# Patient Record
Sex: Female | Born: 1965 | Race: White | Hispanic: No | Marital: Married | Smoking: Never smoker
Health system: Southern US, Community
[De-identification: ages and names within clinical notes are randomized; demographics above are authoritative.]

## PROBLEM LIST (undated history)

## (undated) DIAGNOSIS — M545 Low back pain, unspecified: Secondary | ICD-10-CM

## (undated) DIAGNOSIS — K5901 Slow transit constipation: Secondary | ICD-10-CM

## (undated) DIAGNOSIS — M5136 Other intervertebral disc degeneration, lumbar region: Secondary | ICD-10-CM

## (undated) DIAGNOSIS — N3281 Overactive bladder: Secondary | ICD-10-CM

## (undated) DIAGNOSIS — R32 Unspecified urinary incontinence: Secondary | ICD-10-CM

## (undated) DIAGNOSIS — K219 Gastro-esophageal reflux disease without esophagitis: Secondary | ICD-10-CM

## (undated) DIAGNOSIS — R42 Dizziness and giddiness: Secondary | ICD-10-CM

## (undated) DIAGNOSIS — R0789 Other chest pain: Secondary | ICD-10-CM

## (undated) DIAGNOSIS — R1013 Epigastric pain: Secondary | ICD-10-CM

## (undated) DIAGNOSIS — Z862 Personal history of diseases of the blood and blood-forming organs and certain disorders involving the immune mechanism: Secondary | ICD-10-CM

## (undated) DIAGNOSIS — M722 Plantar fascial fibromatosis: Secondary | ICD-10-CM

## (undated) HISTORY — DX: Unspecified urinary incontinence: R32

## (undated) HISTORY — DX: Other chest pain: R07.89

## (undated) HISTORY — DX: Low back pain, unspecified: M54.50

## (undated) HISTORY — PX: AUGMENTATION MAMMAPLASTY: SUR837

## (undated) HISTORY — DX: Dizziness and giddiness: R42

---
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
## 2013-11-23 ENCOUNTER — Other Ambulatory Visit (HOSPITAL_COMMUNITY)
Admission: RE | Admit: 2013-11-23 | Discharge: 2013-11-23 | Disposition: A | Discharge status: Home / Self Care | Payer: Managed Care, Other (non HMO) | Source: Ambulatory Visit | Attending: Family Medicine | Admitting: Family Medicine

## 2013-11-23 DIAGNOSIS — Z1151 Encounter for screening for human papillomavirus (HPV): Secondary | ICD-10-CM | POA: Insufficient documentation

## 2013-11-23 DIAGNOSIS — Z01419 Encounter for gynecological examination (general) (routine) without abnormal findings: Secondary | ICD-10-CM | POA: Insufficient documentation

## 2015-07-15 ENCOUNTER — Other Ambulatory Visit: Payer: Self-pay

## 2015-07-15 DIAGNOSIS — Z1231 Encounter for screening mammogram for malignant neoplasm of breast: Secondary | ICD-10-CM

## 2015-08-07 ENCOUNTER — Other Ambulatory Visit: Payer: Self-pay | Admitting: Orthopedic Surgery

## 2015-08-07 DIAGNOSIS — S335XXA Sprain of ligaments of lumbar spine, initial encounter: Secondary | ICD-10-CM

## 2015-08-12 ENCOUNTER — Ambulatory Visit
Admission: RE | Admit: 2015-08-12 | Discharge: 2015-08-12 | Disposition: A | Discharge status: Home / Self Care | Payer: BLUE CROSS/BLUE SHIELD | Source: Ambulatory Visit | Attending: Orthopedic Surgery | Admitting: Orthopedic Surgery

## 2015-08-12 DIAGNOSIS — S335XXA Sprain of ligaments of lumbar spine, initial encounter: Secondary | ICD-10-CM

## 2015-08-22 ENCOUNTER — Ambulatory Visit: Payer: Managed Care, Other (non HMO)

## 2015-09-02 ENCOUNTER — Ambulatory Visit: Payer: Managed Care, Other (non HMO)

## 2015-09-06 ENCOUNTER — Other Ambulatory Visit: Payer: Self-pay

## 2015-09-06 DIAGNOSIS — Z9882 Breast implant status: Secondary | ICD-10-CM

## 2015-09-06 DIAGNOSIS — Z1231 Encounter for screening mammogram for malignant neoplasm of breast: Secondary | ICD-10-CM

## 2015-09-18 ENCOUNTER — Ambulatory Visit
Admission: RE | Admit: 2015-09-18 | Discharge: 2015-09-18 | Disposition: A | Discharge status: Home / Self Care | Payer: BLUE CROSS/BLUE SHIELD | Source: Ambulatory Visit

## 2015-09-18 DIAGNOSIS — Z9882 Breast implant status: Secondary | ICD-10-CM

## 2015-09-18 DIAGNOSIS — Z1231 Encounter for screening mammogram for malignant neoplasm of breast: Secondary | ICD-10-CM

## 2016-10-23 ENCOUNTER — Other Ambulatory Visit: Payer: Self-pay | Admitting: Obstetrics and Gynecology

## 2016-10-23 ENCOUNTER — Other Ambulatory Visit: Payer: Self-pay | Admitting: Family Medicine

## 2016-10-23 DIAGNOSIS — Z1231 Encounter for screening mammogram for malignant neoplasm of breast: Secondary | ICD-10-CM

## 2016-10-27 ENCOUNTER — Telehealth: Payer: Self-pay

## 2016-10-27 NOTE — Telephone Encounter (Signed)
NOTES SENT TO SCHEDULING.  °

## 2016-11-10 ENCOUNTER — Encounter: Payer: Self-pay | Admitting: Cardiology

## 2016-11-12 ENCOUNTER — Ambulatory Visit
Admission: RE | Admit: 2016-11-12 | Discharge: 2016-11-12 | Disposition: A | Discharge status: Home / Self Care | Payer: PRIVATE HEALTH INSURANCE | Source: Ambulatory Visit | Attending: Family Medicine | Admitting: Family Medicine

## 2016-11-12 DIAGNOSIS — Z1231 Encounter for screening mammogram for malignant neoplasm of breast: Secondary | ICD-10-CM

## 2016-11-19 ENCOUNTER — Ambulatory Visit: Payer: PRIVATE HEALTH INSURANCE | Admitting: Cardiology

## 2016-11-27 ENCOUNTER — Encounter: Payer: Self-pay | Admitting: *Deleted

## 2016-11-27 DIAGNOSIS — Z862 Personal history of diseases of the blood and blood-forming organs and certain disorders involving the immune mechanism: Secondary | ICD-10-CM | POA: Insufficient documentation

## 2016-11-27 DIAGNOSIS — R3915 Urgency of urination: Secondary | ICD-10-CM | POA: Insufficient documentation

## 2016-11-27 DIAGNOSIS — R350 Frequency of micturition: Secondary | ICD-10-CM | POA: Insufficient documentation

## 2016-11-27 HISTORY — DX: Personal history of diseases of the blood and blood-forming organs and certain disorders involving the immune mechanism: Z86.2

## 2016-12-15 ENCOUNTER — Ambulatory Visit: Payer: PRIVATE HEALTH INSURANCE | Admitting: Cardiology

## 2017-10-04 ENCOUNTER — Other Ambulatory Visit: Payer: Self-pay | Admitting: Family Medicine

## 2017-10-04 DIAGNOSIS — Z139 Encounter for screening, unspecified: Secondary | ICD-10-CM

## 2017-11-11 ENCOUNTER — Other Ambulatory Visit (HOSPITAL_COMMUNITY)
Admission: RE | Admit: 2017-11-11 | Discharge: 2017-11-11 | Disposition: A | Discharge status: Home / Self Care | Payer: PRIVATE HEALTH INSURANCE | Source: Ambulatory Visit | Attending: Family Medicine | Admitting: Family Medicine

## 2017-11-11 ENCOUNTER — Other Ambulatory Visit: Payer: Self-pay | Admitting: Family Medicine

## 2017-11-11 DIAGNOSIS — Z124 Encounter for screening for malignant neoplasm of cervix: Secondary | ICD-10-CM | POA: Diagnosis not present

## 2017-11-12 LAB — CYTOLOGY - PAP
Adequacy: ABSENT
DIAGNOSIS: NEGATIVE
HPV (WINDOPATH): NOT DETECTED

## 2017-11-19 ENCOUNTER — Ambulatory Visit
Admission: RE | Admit: 2017-11-19 | Discharge: 2017-11-19 | Disposition: A | Discharge status: Home / Self Care | Payer: PRIVATE HEALTH INSURANCE | Source: Ambulatory Visit | Attending: Family Medicine | Admitting: Family Medicine

## 2017-11-19 DIAGNOSIS — Z139 Encounter for screening, unspecified: Secondary | ICD-10-CM

## 2017-12-31 ENCOUNTER — Other Ambulatory Visit: Payer: Self-pay | Admitting: Gastroenterology

## 2017-12-31 ENCOUNTER — Ambulatory Visit
Admission: RE | Admit: 2017-12-31 | Discharge: 2017-12-31 | Disposition: A | Discharge status: Home / Self Care | Payer: PRIVATE HEALTH INSURANCE | Source: Ambulatory Visit | Attending: Gastroenterology | Admitting: Gastroenterology

## 2017-12-31 DIAGNOSIS — R52 Pain, unspecified: Secondary | ICD-10-CM

## 2018-01-03 ENCOUNTER — Other Ambulatory Visit: Payer: Self-pay | Admitting: Physician Assistant

## 2018-01-03 DIAGNOSIS — R1013 Epigastric pain: Secondary | ICD-10-CM

## 2018-01-03 DIAGNOSIS — R1031 Right lower quadrant pain: Secondary | ICD-10-CM

## 2018-01-03 DIAGNOSIS — R1032 Left lower quadrant pain: Secondary | ICD-10-CM

## 2018-01-03 DIAGNOSIS — R110 Nausea: Secondary | ICD-10-CM

## 2018-01-06 ENCOUNTER — Other Ambulatory Visit: Payer: PRIVATE HEALTH INSURANCE

## 2018-05-13 ENCOUNTER — Other Ambulatory Visit: Payer: Self-pay | Admitting: Orthopedic Surgery

## 2018-05-13 DIAGNOSIS — M545 Low back pain: Secondary | ICD-10-CM

## 2018-05-13 DIAGNOSIS — M79651 Pain in right thigh: Secondary | ICD-10-CM

## 2018-05-22 ENCOUNTER — Ambulatory Visit
Admission: RE | Admit: 2018-05-22 | Discharge: 2018-05-22 | Disposition: A | Discharge status: Home / Self Care | Payer: PRIVATE HEALTH INSURANCE | Source: Ambulatory Visit | Attending: Orthopedic Surgery | Admitting: Orthopedic Surgery

## 2018-05-22 DIAGNOSIS — M79651 Pain in right thigh: Secondary | ICD-10-CM

## 2018-05-22 DIAGNOSIS — M545 Low back pain: Secondary | ICD-10-CM

## 2019-02-09 ENCOUNTER — Other Ambulatory Visit (HOSPITAL_COMMUNITY)
Admission: RE | Admit: 2019-02-09 | Discharge: 2019-02-09 | Disposition: A | Discharge status: Home / Self Care | Payer: PRIVATE HEALTH INSURANCE | Source: Ambulatory Visit | Attending: Family Medicine | Admitting: Family Medicine

## 2019-02-09 ENCOUNTER — Encounter: Payer: Self-pay | Admitting: Cardiology

## 2019-02-09 ENCOUNTER — Other Ambulatory Visit: Payer: Self-pay | Admitting: Family Medicine

## 2019-02-09 DIAGNOSIS — Z124 Encounter for screening for malignant neoplasm of cervix: Secondary | ICD-10-CM | POA: Diagnosis not present

## 2019-02-14 ENCOUNTER — Other Ambulatory Visit: Payer: Self-pay | Admitting: Family Medicine

## 2019-02-14 DIAGNOSIS — E2839 Other primary ovarian failure: Secondary | ICD-10-CM

## 2019-02-14 DIAGNOSIS — Z1231 Encounter for screening mammogram for malignant neoplasm of breast: Secondary | ICD-10-CM

## 2019-02-14 LAB — CYTOLOGY - PAP
Diagnosis: NEGATIVE
HPV: NOT DETECTED

## 2019-03-23 ENCOUNTER — Ambulatory Visit (INDEPENDENT_AMBULATORY_CARE_PROVIDER_SITE_OTHER): Payer: PRIVATE HEALTH INSURANCE

## 2019-03-23 ENCOUNTER — Other Ambulatory Visit: Payer: Self-pay

## 2019-03-23 ENCOUNTER — Encounter: Payer: Self-pay | Admitting: Podiatry

## 2019-03-23 ENCOUNTER — Ambulatory Visit: Payer: PRIVATE HEALTH INSURANCE | Admitting: Podiatry

## 2019-03-23 VITALS — BP 105/61 | HR 79 | Temp 97.3°F

## 2019-03-23 DIAGNOSIS — M722 Plantar fascial fibromatosis: Secondary | ICD-10-CM

## 2019-03-23 MED ORDER — DICLOFENAC SODIUM 75 MG PO TBEC: 75.0000 mg | DELAYED_RELEASE_TABLET | Freq: Two times a day (BID) | ORAL | 0 refills | Status: DC

## 2019-03-23 NOTE — Progress Notes (Signed)
Subjective:   Patient ID: Debbie Zimmerman, female   DOB: 53 y.o.   MRN: [id number]   HPI Patient presents with severe heel pain bilateral left over right stating that it is been hurting her for several months and she does not remember specific injury.  States is gradually gotten worse over that time   Review of Systems  All other systems reviewed and are negative.       Objective:  Physical Exam Vitals signs and nursing note reviewed.  Constitutional:      Appearance: She is well-developed.  Pulmonary:     Effort: Pulmonary effort is normal.  Musculoskeletal: Normal range of motion.  Skin:    General: Skin is warm.  Neurological:     Mental Status: She is alert.     Neurovascular status found to be intact muscle strength was adequate range of motion within normal limits with patient found to have exquisite discomfort of the plantar fascial left over right with inflammation fluid of the medial band with moderate depression of the arch.  Patient is noted to have good digital perfusion is well oriented x3     Assessment:  Acute plantar fasciitis bilateral with inflammation fluid of the medial band left over right     Plan:  H&P condition reviewed and today I did sterile prep injected plantar fascial bilateral 3 mg Kenalog 5 mg Xylocaine applied fascial brace bilateral placed on 12-day Medrol Dosepak and gave instructions for physical therapy shoe gear modifications.  Reappoint to recheck 2 weeks  X-ray indicates there is small spur formation with no indication stress fracture arthritis

## 2019-03-23 NOTE — Patient Instructions (Signed)

## 2019-03-27 ENCOUNTER — Telehealth: Payer: Self-pay | Admitting: Podiatry

## 2019-03-27 NOTE — Telephone Encounter (Signed)
I called pt and asked if she had any other symptoms. She stated she had weakness and tingling in the left arm below the elbow and some pain at her heart. I told pt it was good that she had stopped the diclofenac. I told pt I would send this message to Dr. Paulla Dolly, but I felt she would needed to either go to the ED or PCP. I told pt that after she was evaluated if she would call our office I would ask Dr. Paulla Dolly if he wanted to order a different antiinflammatory.

## 2019-03-27 NOTE — Telephone Encounter (Signed)
Pt was prescribed Diclofenac but when she takes the medication she gets a tingling sensation in her left arm. Pt has stopped taking the medication and wants to know if there is something else that can be prescribed.

## 2019-03-29 ENCOUNTER — Encounter: Payer: Self-pay | Admitting: *Deleted

## 2019-03-29 ENCOUNTER — Encounter: Payer: Self-pay | Admitting: Cardiology

## 2019-03-29 ENCOUNTER — Telehealth: Payer: Self-pay | Admitting: *Deleted

## 2019-03-29 DIAGNOSIS — M722 Plantar fascial fibromatosis: Secondary | ICD-10-CM

## 2019-03-29 DIAGNOSIS — R32 Unspecified urinary incontinence: Secondary | ICD-10-CM | POA: Insufficient documentation

## 2019-03-29 DIAGNOSIS — M51369 Other intervertebral disc degeneration, lumbar region without mention of lumbar back pain or lower extremity pain: Secondary | ICD-10-CM

## 2019-03-29 DIAGNOSIS — M545 Low back pain, unspecified: Secondary | ICD-10-CM | POA: Insufficient documentation

## 2019-03-29 DIAGNOSIS — N3281 Overactive bladder: Secondary | ICD-10-CM

## 2019-03-29 DIAGNOSIS — R0789 Other chest pain: Secondary | ICD-10-CM | POA: Insufficient documentation

## 2019-03-29 DIAGNOSIS — R42 Dizziness and giddiness: Secondary | ICD-10-CM | POA: Insufficient documentation

## 2019-03-29 DIAGNOSIS — R1013 Epigastric pain: Secondary | ICD-10-CM

## 2019-03-29 DIAGNOSIS — K5901 Slow transit constipation: Secondary | ICD-10-CM

## 2019-03-29 DIAGNOSIS — M5136 Other intervertebral disc degeneration, lumbar region: Secondary | ICD-10-CM

## 2019-03-29 HISTORY — DX: Epigastric pain: R10.13

## 2019-03-29 HISTORY — DX: Overactive bladder: N32.81

## 2019-03-29 HISTORY — DX: Other intervertebral disc degeneration, lumbar region without mention of lumbar back pain or lower extremity pain: M51.369

## 2019-03-29 HISTORY — DX: Slow transit constipation: K59.01

## 2019-03-29 HISTORY — DX: Plantar fascial fibromatosis: M72.2

## 2019-03-29 HISTORY — DX: Other intervertebral disc degeneration, lumbar region: M51.36

## 2019-03-29 NOTE — Telephone Encounter (Signed)
[facility] Melissa faxed pt's office visit to me and I gave copy to DR. Regal and sent original to scan.

## 2019-03-29 NOTE — Telephone Encounter (Signed)
Pt states she went to her doctor concerning the reaction she had from the medication Dr. Paulla Dolly prescribed.

## 2019-03-29 NOTE — Telephone Encounter (Signed)
I called [facility] Tammy states she will try to get [facility] or her nurse to speak with me concerning pt's office today. Melissa - PA Wharton's nurse states the chest pain was not related to pt taking the Diclofenac, but due to pt's family history of heart problems, further testing has been ordered.

## 2019-03-30 ENCOUNTER — Other Ambulatory Visit: Payer: Self-pay

## 2019-03-30 ENCOUNTER — Encounter: Payer: Self-pay | Admitting: Cardiology

## 2019-03-30 ENCOUNTER — Ambulatory Visit (INDEPENDENT_AMBULATORY_CARE_PROVIDER_SITE_OTHER): Payer: PRIVATE HEALTH INSURANCE | Admitting: Cardiology

## 2019-03-30 VITALS — BP 124/70 | HR 82 | Ht <= 58 in | Wt 155.0 lb

## 2019-03-30 DIAGNOSIS — R079 Chest pain, unspecified: Secondary | ICD-10-CM | POA: Diagnosis not present

## 2019-03-30 DIAGNOSIS — R0789 Other chest pain: Secondary | ICD-10-CM

## 2019-03-30 MED ORDER — NONFORMULARY OR COMPOUNDED ITEM: 5 refills | Status: AC

## 2019-03-30 NOTE — Patient Instructions (Signed)
Medication Instructions:  Your physician recommends that you continue on your current medications as directed. Please refer to the Current Medication list given to you today.  If you need a refill on your cardiac medications before your next appointment, please call your pharmacy.   Lab work: NONE If you have labs (blood work) drawn today and your tests are completely normal, you will receive your results only by: . MyChart Message (if you have MyChart) OR . A paper copy in the mail If you have any lab test that is abnormal or we need to change your treatment, we will call you to review the results.  Testing/Procedures: Your physician has requested that you have a lexiscan myoview. For further information please visit [email or web address]. Please follow instruction sheet, as given.   Follow-Up: At [facility], you and your health needs are our priority.  As part of our continuing mission to provide you with exceptional heart care, we have created designated Provider Care Teams.  These Care Teams include your primary Cardiologist (physician) and Advanced Practice Providers (APPs -  Physician Assistants and Nurse Practitioners) who all work together to provide you with the care you need, when you need it. You will need a follow up appointment in 6 months.   Any Other Special Instructions Will Be Listed Below Regadenoson injection What is this medicine? REGADENOSON is used to test the heart for coronary artery disease. It is used in patients who can not exercise for their stress test. This medicine may be used for other purposes; ask your health care provider or pharmacist if you have questions. COMMON BRAND NAME(S): Lexiscan What should I tell my health care provider before I take this medicine? They need to know if you have any of these conditions:  heart problems  lung or breathing disease, like asthma or COPD  an unusual or allergic reaction to regadenoson, other medicines, foods,  dyes, or preservatives  pregnant or trying to get pregnant  breast-feeding How should I use this medicine? This medicine is for injection into a vein. It is given by a health care professional in a hospital or clinic setting. Talk to your pediatrician regarding the use of this medicine in children. Special care may be needed. Overdosage: If you think you have taken too much of this medicine contact a poison control center or emergency room at once. NOTE: This medicine is only for you. Do not share this medicine with others. What if I miss a dose? This does not apply. What may interact with this medicine?  caffeine  dipyridamole  guarana  theophylline This list may not describe all possible interactions. Give your health care provider a list of all the medicines, herbs, non-prescription drugs, or dietary supplements you use. Also tell them if you smoke, drink alcohol, or use illegal drugs. Some items may interact with your medicine. What should I watch for while using this medicine? Your condition will be monitored carefully while you are receiving this medicine. Do not take medicines, foods, or drinks with caffeine (like coffee, tea, or colas) for at least 12 hours before your test. If you do not know if something contains caffeine, ask your health care professional. What side effects may I notice from receiving this medicine? Side effects that you should report to your doctor or health care professional as soon as possible:  allergic reactions like skin rash, itching or hives, swelling of the face, lips, or tongue  breathing problems  chest pain, tightness or palpitations  severe   headache Side effects that usually do not require medical attention (report to your doctor or health care professional if they continue or are bothersome):  flushing  headache  irritation or pain at site where injected  nausea, vomiting This list may not describe all possible side effects. Call  your doctor for medical advice about side effects. You may report side effects to FDA at 1-800-FDA-1088. Where should I keep my medicine? This drug is given in a hospital or clinic and will not be stored at home. NOTE: This sheet is a summary. It may not cover all possible information. If you have questions about this medicine, talk to your doctor, pharmacist, or health care provider.  2020 Elsevier/Gold Standard (2008-04-09 15:08:13)  Cardiac Nuclear Scan A cardiac nuclear scan is a test that is done to check the flow of blood to your heart. It is done when you are resting and when you are exercising. The test looks for problems such as:  Not enough blood reaching a portion of the heart.  The heart muscle not working as it should. You may need this test if:  You have heart disease.  You have had lab results that are not normal.  You have had heart surgery or a balloon procedure to open up blocked arteries (angioplasty).  You have chest pain.  You have shortness of breath. In this test, a special dye (tracer) is put into your bloodstream. The tracer will travel to your heart. A camera will then take pictures of your heart to see how the tracer moves through your heart. This test is usually done at a hospital and takes 2-4 hours. Tell a doctor about:  Any allergies you have.  All medicines you are taking, including vitamins, herbs, eye drops, creams, and over-the-counter medicines.  Any problems you or family members have had with anesthetic medicines.  Any blood disorders you have.  Any surgeries you have had.  Any medical conditions you have.  Whether you are pregnant or may be pregnant. What are the risks? Generally, this is a safe test. However, problems may occur, such as:  Serious chest pain and heart attack. This is only a risk if the stress portion of the test is done.  Rapid heartbeat.  A feeling of warmth in your chest. This feeling usually does not last long.   Allergic reaction to the tracer. What happens before the test?  Ask your doctor about changing or stopping your normal medicines. This is important.  Follow instructions from your doctor about what you cannot eat or drink.  Remove your jewelry on the day of the test. What happens during the test?  An IV tube will be inserted into one of your veins.  Your doctor will give you a small amount of tracer through the IV tube.  You will wait for 20-40 minutes while the tracer moves through your bloodstream.  Your heart will be monitored with an electrocardiogram (ECG).  You will lie down on an exam table.  Pictures of your heart will be taken for about 15-20 minutes.  You may also have a stress test. For this test, one of these things may be done: ? You will be asked to exercise on a treadmill or a stationary bike. ? You will be given medicines that will make your heart work harder. This is done if you are unable to exercise.  When blood flow to your heart has peaked, a tracer will again be given through the IV tube.    After 20-40 minutes, you will get back on the exam table. More pictures will be taken of your heart.  Depending on the tracer that is used, more pictures may need to be taken 3-4 hours later.  Your IV tube will be removed when the test is over. The test may vary among doctors and hospitals. What happens after the test?  Ask your doctor: ? Whether you can return to your normal schedule, including diet, activities, and medicines. ? Whether you should drink more fluids. This will help to remove the tracer from your body. Drink enough fluid to keep your pee (urine) pale yellow.  Ask your doctor, or the department that is doing the test: ? When will my results be ready? ? How will I get my results? Summary  A cardiac nuclear scan is a test that is done to check the flow of blood to your heart.  Tell your doctor whether you are pregnant or may be pregnant.  Before  the test, ask your doctor about changing or stopping your normal medicines. This is important.  Ask your doctor whether you can return to your normal activities. You may be asked to drink more fluids. This information is not intended to replace advice given to you by your health care provider. Make sure you discuss any questions you have with your health care provider. Document Released: 01/24/2018 Document Revised: 11/30/2018 Document Reviewed: 01/24/2018 Elsevier Patient Education  2020 Elsevier Inc.   

## 2019-03-30 NOTE — Progress Notes (Signed)
Cardiology Office Note:    Date:  03/30/2019   ID:  Debbie Zimmerman, DOB 07-Nov-1965, MRN [id number]  PCP:  Jarrett SohoWharton, Courtney, PA-C  Cardiologist:  Garwin Brothersajan R Jameila Keeny, MD   Referring MD: Jarrett SohoWharton, Courtney, PA-C    ASSESSMENT:    1. Chest discomfort    PLAN:    In order of problems listed above:  1. Chest discomfort: Patient is very concerned about the symptoms and wants an evaluation.  This is always been on her mind.  In view of this we will do a Lexiscan sestamibi to rule out any objective evidence of coronary artery disease.  I reviewed her lipids and they were fine.  She tells me that she lost a lot of weight with good diet and exercise and then gave up on the good habits and has gained weight back again and I discussed with her healthy habits and primary prevention and she vocalized understanding.  She knows to go to the nearest emergency room for any concerning symptoms.Patient will be seen in follow-up appointment in 6 months or earlier if the patient has any concerns    Medication Adjustments/Labs and Tests Ordered: Current medicines are reviewed at length with the patient today.  Concerns regarding medicines are outlined above.  No orders of the defined types were placed in this encounter.  No orders of the defined types were placed in this encounter.    History of Present Illness:    Debbie ButtonRebecca Kawamoto is a 53 y.o. female who is being seen today for the evaluation of chest discomfort at the request of Jarrett SohoWharton, [place], [place].  Patient is a pleasant 53 year old female.  She has no significant past medical history from a cardiovascular standpoint.  Patient mentions to me that she got injection to her foot for plantar fasciitis and subsequently had tingling in the left arm.  No orthopnea or PND.  She is a very avid walker but in the past several weeks she has quit walking because of plantar fasciitis.  At the time of my evaluation, the patient is alert awake oriented and in no  distress.  No radiation to any other part of the body.  No chest tightness.  She occasionally has chest discomfort not related to exertion.  She is sexually active and with sexual activity she does not have any of the symptoms.  Past Medical History:  Diagnosis Date   Chest discomfort    DDD (degenerative disc disease), lumbar 03/29/2019   Epigastric pain 03/29/2019   History of anemia 11/27/2016   Incontinence    oab- [facility]   Lightheaded    OAB (overactive bladder) 03/29/2019   Pain in lower back    Plantar fasciitis 03/29/2019   Slow transit constipation 03/29/2019    Past Surgical History:  Procedure Laterality Date   AUGMENTATION MAMMAPLASTY     CESAREAN SECTION      Current Medications: Current Meds  Medication Sig   mirabegron ER (MYRBETRIQ) 50 MG TB24 tablet Take 50 mg by mouth daily.   polyethylene glycol (MIRALAX / GLYCOLAX) 17 g packet Take 17 g by mouth daily.     Allergies:   Codeine, Diclofenac, and Trimethoprim-sulfamethoxazole [sulfamethoxazole-trimethoprim]   Social History   Socioeconomic History   Marital status: Married    Spouse name: BRIAN   Number of children: 1   Years of education: COLLEGE   Highest education level: Not on file  Occupational History   Occupation: UNEMPLOYED  [employer]   Financial resource strain: Not on  file   Food insecurity    Worry: Not on file    Inability: Not on file   Transportation needs    Medical: Not on file    Non-medical: Not on file  Tobacco Use   Smoking status: Never Smoker   Smokeless tobacco: Never Used  Substance and Sexual Activity   Alcohol use: No   Drug use: No   Sexual activity: Not on file  Lifestyle   Physical activity    Days per week: Not on file    Minutes per session: Not on file   Stress: Not on file  Relationships   Social connections    Talks on phone: Not on file    Gets together: Not on file    Attends religious service: Not on file    Active  member of club or organization: Not on file    Attends meetings of clubs or organizations: Not on file    Relationship status: Not on file  Other Topics Concern   Not on file  Social History Narrative   Not on file     Family History: The patient's family history includes CAD in her father; Diabetes in her mother; Heart Problems in her maternal grandfather, paternal grandfather, and paternal grandmother.  ROS:   Please see the history of present illness.    All other systems reviewed and are negative.  EKGs/Labs/Other Studies Reviewed:    The following studies were reviewed today: EKG reveals sinus rhythm and nonspecific ST-T changes   Recent Labs: No results found for requested labs within last 8760 hours.  Recent Lipid Panel No results found for: CHOL, TRIG, HDL, CHOLHDL, VLDL, LDLCALC, LDLDIRECT  Physical Exam:    VS:  BP 124/70    Pulse 82    Ht 4\' 10"  (1.473 m)    Wt 155 lb (70.3 kg)    SpO2 97%    BMI 32.40 kg/m     Wt Readings from Last 3 Encounters:  03/30/19 155 lb (70.3 kg)  03/29/19 155 lb (70.3 kg)     GEN: Patient is in no acute distress HEENT: Normal NECK: No JVD; No carotid bruits LYMPHATICS: No lymphadenopathy CARDIAC: S1 S2 regular, 2/6 systolic murmur at the apex. RESPIRATORY:  Clear to auscultation without rales, wheezing or rhonchi  ABDOMEN: Soft, non-tender, non-distended MUSCULOSKELETAL:  No edema; No deformity  SKIN: Warm and dry NEUROLOGIC:  Alert and oriented x 3 PSYCHIATRIC:  Normal affect    Signed, Jenean Lindau, MD  03/30/2019 8:43 AM    Organ

## 2019-03-30 NOTE — Telephone Encounter (Signed)
Pt called states she is following up on the request for medication for the foot pain, would even use a topical.

## 2019-03-30 NOTE — Addendum Note (Signed)
Addended by: Beckey Rutter on: 03/30/2019 08:52 AM   Modules accepted: Orders

## 2019-03-30 NOTE — Telephone Encounter (Signed)
I don't see them

## 2019-03-30 NOTE — Telephone Encounter (Signed)
I informed pt that Dr. Paulla Dolly felt the topical antiinflammatory would be helpful and I informed pt Dr. Paulla Dolly ordered the antiinflammatory compound cream from [facility]. Faxed order to [facility].

## 2019-04-06 ENCOUNTER — Encounter: Payer: Self-pay | Admitting: Podiatry

## 2019-04-06 ENCOUNTER — Ambulatory Visit: Payer: PRIVATE HEALTH INSURANCE | Admitting: Podiatry

## 2019-04-06 ENCOUNTER — Other Ambulatory Visit: Payer: Self-pay

## 2019-04-06 VITALS — Temp 98.3°F

## 2019-04-06 DIAGNOSIS — M722 Plantar fascial fibromatosis: Secondary | ICD-10-CM

## 2019-04-06 NOTE — Progress Notes (Signed)
Subjective:   Patient ID: Debbie Zimmerman, female   DOB: 53 y.o.   MRN: [id number]   HPI Patient states she still having pain and has improvement but still quite sore in the plantar heel region bilateral.  States that it is about 30 to 40% improved   ROS      Objective:  Physical Exam  Neurovascular status intact with continued discomfort upon deep palpation to the plantar fascial bilateral at its insertion into the navicular     Assessment:  Acute fasciitis bilateral with inflammation pain still present     Plan:  Went ahead today did sterile prep and reinjected the fascia 3 mg Kenalog 5 mg Xylocaine at insertion and reapplied sterile dressings and will begin Aleve 2 pills twice a day and will be seen back 3 weeks or earlier if needed

## 2019-04-21 ENCOUNTER — Other Ambulatory Visit: Payer: Self-pay | Admitting: Family Medicine

## 2019-04-21 DIAGNOSIS — N644 Mastodynia: Secondary | ICD-10-CM

## 2019-04-27 ENCOUNTER — Ambulatory Visit: Payer: PRIVATE HEALTH INSURANCE | Admitting: Podiatry

## 2019-05-03 ENCOUNTER — Other Ambulatory Visit: Payer: Self-pay | Admitting: Family Medicine

## 2019-05-03 ENCOUNTER — Ambulatory Visit
Admission: RE | Admit: 2019-05-03 | Discharge: 2019-05-03 | Disposition: A | Discharge status: Home / Self Care | Payer: PRIVATE HEALTH INSURANCE | Source: Ambulatory Visit | Attending: Family Medicine | Admitting: Family Medicine

## 2019-05-03 ENCOUNTER — Ambulatory Visit: Payer: PRIVATE HEALTH INSURANCE

## 2019-05-03 ENCOUNTER — Other Ambulatory Visit: Payer: Self-pay

## 2019-05-03 DIAGNOSIS — E2839 Other primary ovarian failure: Secondary | ICD-10-CM

## 2019-05-03 DIAGNOSIS — N644 Mastodynia: Secondary | ICD-10-CM

## 2019-05-09 ENCOUNTER — Ambulatory Visit (HOSPITAL_COMMUNITY)
Admission: RE | Admit: 2019-05-09 | Payer: PRIVATE HEALTH INSURANCE | Source: Ambulatory Visit | Attending: Cardiology | Admitting: Cardiology

## 2019-05-15 ENCOUNTER — Ambulatory Visit: Payer: PRIVATE HEALTH INSURANCE | Admitting: Cardiology

## 2019-10-05 ENCOUNTER — Other Ambulatory Visit: Payer: Self-pay

## 2019-10-05 ENCOUNTER — Encounter: Payer: Self-pay | Admitting: Cardiology

## 2019-10-05 ENCOUNTER — Ambulatory Visit (INDEPENDENT_AMBULATORY_CARE_PROVIDER_SITE_OTHER): Payer: PRIVATE HEALTH INSURANCE | Admitting: Cardiology

## 2019-10-05 VITALS — BP 118/78 | HR 89 | Temp 99.0°F | Ht <= 58 in | Wt 164.1 lb

## 2019-10-05 DIAGNOSIS — R0789 Other chest pain: Secondary | ICD-10-CM

## 2019-10-05 DIAGNOSIS — R072 Precordial pain: Secondary | ICD-10-CM

## 2019-10-05 NOTE — Patient Instructions (Signed)
Medication Instructions:  No medication changes *If you need a refill on your cardiac medications before your next appointment, please call your pharmacy*  Lab Work: None ordered If you have labs (blood work) drawn today and your tests are completely normal, you will receive your results only by: Marland Kitchen MyChart Message (if you have MyChart) OR . A paper copy in the mail If you have any lab test that is abnormal or we need to change your treatment, we will call you to review the results.  Testing/Procedures: Your physician has requested that you have a lexiscan myoview. For further information please visit [email or web address] Please follow instruction sheet, as given.   Follow-Up: At [facility], you and your health needs are our priority.  As part of our continuing mission to provide you with exceptional heart care, we have created designated Provider Care Teams.  These Care Teams include your primary Cardiologist (physician) and Advanced Practice Providers (APPs -  Physician Assistants and Nurse Practitioners) who all work together to provide you with the care you need, when you need it.  Your next appointment:   6 month(s)  The format for your next appointment:   In Person  Provider:   Belva Crome, MD  Other Instructions NA

## 2019-10-05 NOTE — Progress Notes (Signed)
Cardiology Office Note:    Date:  10/05/2019   ID:  Debbie Zimmerman, DOB [birth year], MRN [id number]  PCP:  Christa See, FNP  Cardiologist:  Jenean Lindau, MD   Referring MD: Marda Stalker, PA-C    ASSESSMENT:    1. Chest discomfort    PLAN:    In order of problems listed above:  1. Chest discomfort: The symptoms appear atypical in nature.  However because of her concerns and risk factors I will do a Lexiscan sestamibi.  She is agreeable.  She knows to go to the nearest emergency room for any concerning symptoms. 2. Diet was discussed in view of the fact that she is overweight.  She promises to do better.  She is going to be more strict with her diet and lose weight.Patient will be seen in follow-up appointment in 6 months or earlier if the patient has any concerns    Medication Adjustments/Labs and Tests Ordered: Current medicines are reviewed at length with the patient today.  Concerns regarding medicines are outlined above.  No orders of the defined types were placed in this encounter.  No orders of the defined types were placed in this encounter.    No chief complaint on file.    History of Present Illness:    Debbie Zimmerman is a 54 y.o. female.  Patient has past medical history that is not much significant.  Overall she is active lady.  But because of plantar fasciitis she is living a sedentary lifestyle.  She complains of occasional chest tightness not related to exertion.  No orthopnea or PND.  She is due to get a stress test but postponed it in the past because of the pandemic.  Now she is here for evaluation.  At the time of my evaluation, the patient is alert awake oriented and in no distress.  Past Medical History:  Diagnosis Date  . Chest discomfort   . DDD (degenerative disc disease), lumbar 03/29/2019  . Epigastric pain 03/29/2019  . History of anemia 11/27/2016  . Incontinence    oab- [facility]  . Lightheaded   . OAB (overactive  bladder) 03/29/2019  . Pain in lower back   . Plantar fasciitis 03/29/2019  . Slow transit constipation 03/29/2019    Past Surgical History:  Procedure Laterality Date  . AUGMENTATION MAMMAPLASTY    . CESAREAN SECTION      Current Medications: Current Meds  Medication Sig  . mirabegron ER (MYRBETRIQ) 50 MG TB24 tablet Take 50 mg by mouth daily.  . NONFORMULARY OR COMPOUNDED ITEM [facility]: Achilles Tendonitis - Baclofen 2%, Bupivacaine 1%, Gabapentin 6%, Ibuprofen 3%, Pentoxifylline 3%, apply 1-2 grams to affected area 3-4 times a day. (omit Diclofenac)  . polyethylene glycol (MIRALAX / GLYCOLAX) 17 g packet Take 17 g by mouth daily.     Allergies:   Codeine, Diclofenac, and Trimethoprim-sulfamethoxazole [sulfamethoxazole-trimethoprim]   Social History   Socioeconomic History  . Marital status: Married    Spouse name: BRIAN  . Number of children: 1  . Years of education: COLLEGE  . Highest education level: Not on file  Occupational History  . Occupation: UNEMPLOYED  Tobacco Use  . Smoking status: Never Smoker  . Smokeless tobacco: Never Used  Substance and Sexual Activity  . Alcohol use: No  . Drug use: No  . Sexual activity: Not on file  Other Topics Concern  . Not on file  Social History Narrative  . Not on file   Social  Determinants of Health   Financial Resource Strain:   . Difficulty of Paying Living Expenses: Not on file  Food Insecurity:   . Worried About Programme researcher, broadcasting/film/video in the Last Year: Not on file  . Ran Out of Food in the Last Year: Not on file  Transportation Needs:   . Lack of Transportation (Medical): Not on file  . Lack of Transportation (Non-Medical): Not on file  Physical Activity:   . Days of Exercise per Week: Not on file  . Minutes of Exercise per Session: Not on file  Stress:   . Feeling of Stress : Not on file  Social Connections:   . Frequency of Communication with Friends and Family: Not on file  . Frequency of Social  Gatherings with Friends and Family: Not on file  . Attends Religious Services: Not on file  . Active Member of Clubs or Organizations: Not on file  . Attends Banker Meetings: Not on file  . Marital Status: Not on file     Family History: The patient's family history includes CAD in her father; Diabetes in her mother; Heart Problems in her maternal grandfather, paternal grandfather, and paternal grandmother.  ROS:   Please see the history of present illness.    All other systems reviewed and are negative.  EKGs/Labs/Other Studies Reviewed:    The following studies were reviewed today: She mentions to me that she had EKG done at primary care doctor's office and she was told it was fine also sent for blood work including lipids   Recent Labs: No results found for requested labs within last 8760 hours.  Recent Lipid Panel No results found for: CHOL, TRIG, HDL, CHOLHDL, VLDL, LDLCALC, LDLDIRECT  Physical Exam:    VS:  BP 118/78   Pulse 89   Temp 99 F (37.2 C)   Ht 4\' 10"  (1.473 m)   Wt 164 lb 1.3 oz (74.4 kg)   SpO2 100%   BMI 34.29 kg/m     Wt Readings from Last 3 Encounters:  10/05/19 164 lb 1.3 oz (74.4 kg)  03/30/19 155 lb (70.3 kg)  03/29/19 155 lb (70.3 kg)     GEN: Patient is in no acute distress HEENT: Normal NECK: No JVD; No carotid bruits LYMPHATICS: No lymphadenopathy CARDIAC: Hear sounds regular, 2/6 systolic murmur at the apex. RESPIRATORY:  Clear to auscultation without rales, wheezing or rhonchi  ABDOMEN: Soft, non-tender, non-distended MUSCULOSKELETAL:  No edema; No deformity  SKIN: Warm and dry NEUROLOGIC:  Alert and oriented x 3 PSYCHIATRIC:  Normal affect   Signed, 05/29/19, MD  10/05/2019 2:59 PM    [facility]

## 2019-10-17 ENCOUNTER — Telehealth (HOSPITAL_COMMUNITY): Payer: Self-pay

## 2019-10-17 NOTE — Telephone Encounter (Signed)
Encounter complete. 

## 2019-10-19 ENCOUNTER — Ambulatory Visit (HOSPITAL_COMMUNITY)
Admission: RE | Admit: 2019-10-19 | Discharge: 2019-10-19 | Disposition: A | Discharge status: Home / Self Care | Payer: PRIVATE HEALTH INSURANCE | Source: Ambulatory Visit | Attending: Cardiology | Admitting: Cardiology

## 2019-10-19 ENCOUNTER — Other Ambulatory Visit: Payer: Self-pay

## 2019-10-19 DIAGNOSIS — R0789 Other chest pain: Secondary | ICD-10-CM | POA: Diagnosis not present

## 2019-10-19 DIAGNOSIS — R072 Precordial pain: Secondary | ICD-10-CM

## 2019-10-19 LAB — MYOCARDIAL PERFUSION IMAGING
LV dias vol: 74 mL (ref 46–106)
LV sys vol: 25 mL
Peak HR: 125 {beats}/min
Rest HR: 77 {beats}/min
SDS: 0
SRS: 0
SSS: 0
TID: 1.07

## 2019-10-19 MED ORDER — REGADENOSON 0.4 MG/5ML IV SOLN
0.4000 mg | Freq: Once | INTRAVENOUS | Status: AC
  Administered 2019-10-19: 0.4 mg via INTRAVENOUS

## 2019-10-19 MED ORDER — TECHNETIUM TC 99M TETROFOSMIN IV KIT
32.8000 | PACK | Freq: Once | INTRAVENOUS | Status: AC | PRN
  Administered 2019-10-19: 32.8 via INTRAVENOUS
  Filled 2019-10-19: qty 33

## 2019-10-19 MED ORDER — TECHNETIUM TC 99M TETROFOSMIN IV KIT
10.8000 | PACK | Freq: Once | INTRAVENOUS | Status: AC | PRN
  Administered 2019-10-19: 10.8 via INTRAVENOUS
  Filled 2019-10-19: qty 11

## 2019-11-08 ENCOUNTER — Other Ambulatory Visit: Payer: Self-pay

## 2019-11-08 ENCOUNTER — Ambulatory Visit: Payer: PRIVATE HEALTH INSURANCE | Admitting: Podiatry

## 2019-11-08 DIAGNOSIS — M722 Plantar fascial fibromatosis: Secondary | ICD-10-CM | POA: Diagnosis not present

## 2019-11-08 DIAGNOSIS — M2141 Flat foot [pes planus] (acquired), right foot: Secondary | ICD-10-CM

## 2019-11-08 DIAGNOSIS — M21961 Unspecified acquired deformity of right lower leg: Secondary | ICD-10-CM | POA: Diagnosis not present

## 2019-11-10 ENCOUNTER — Encounter: Payer: Self-pay | Admitting: Podiatry

## 2019-11-10 NOTE — Progress Notes (Signed)
Subjective:  Patient ID: Debbie Zimmerman, female    DOB: April 20, 1966,  MRN: [id number]  Chief Complaint  Patient presents with  . Plantar Fasciitis    pt is here for bil plantar fasciitis, pt states that the pain is a 4 out of 10 on the pain scale.     54 y.o. female presents with the above complaint.  Patient presents with right foot plantar fasciitis.  She had was treated in the past for bilateral plantar fasciitis with an injection which helped a lot to decrease the pain.  However her pain came back on the right side.  She has been aggressively walking on her foot.  She is known to Dr. Paulla Dolly.  She states that the pain started coming back 3 weeks ago and progressive gotten worse.  She would like to do another injection to help decrease the pain.  Pain scale is 4 out of 10.  She denies any other acute complaints.  She has been doing her stretching exercises which has not been beneficial.   Review of Systems: Negative except as noted in the HPI. Denies N/V/F/Ch.  Past Medical History:  Diagnosis Date  . Chest discomfort   . DDD (degenerative disc disease), lumbar 03/29/2019  . Epigastric pain 03/29/2019  . History of anemia 11/27/2016  . Incontinence    [facility]ab- [facility]  . Lightheaded   . OAB (overactive bladder) 03/29/2019  . Pain in lower back   . Plantar fasciitis 03/29/2019  . Slow transit constipation 03/29/2019    Current Outpatient Medications:  .  mirabegron ER (MYRBETRIQ) 50 MG TB24 tablet, Take 50 mg by mouth daily., Disp: , Rfl:  .  NONFORMULARY OR COMPOUNDED ITEM, [facility]: Achilles Tendonitis - Baclofen 2%, Bupivacaine 1%, Gabapentin 6%, Ibuprofen 3%, Pentoxifylline 3%, apply 1-2 grams to affected area 3-4 times a day. (omit Diclofenac), Disp: 100 each, Rfl: 5 .  polyethylene glycol (MIRALAX / GLYCOLAX) 17 g packet, Take 17 g by mouth daily., Disp: , Rfl:   Social History   Tobacco Use  Smoking Status Never Smoker  Smokeless Tobacco Never Used     Allergies  Allergen Reactions  . Codeine Other (See Comments)    Other reaction(s): passes out PASSES OUT  . Diclofenac     Pt stated, "My fingers felt tingly and I got chest pain"  . Trimethoprim-Sulfamethoxazole [Sulfamethoxazole-Trimethoprim] Other (See Comments)    Headache   Objective:  There were no vitals filed for this visit. There is no height or weight on file to calculate BMI. Constitutional Well developed. Well nourished.  Vascular Dorsalis pedis pulses palpable bilaterally. Posterior tibial pulses palpable bilaterally. Capillary refill normal to all digits.  No cyanosis or clubbing noted. Pedal hair growth normal.  Neurologic Normal speech. Oriented to person, place, and time. Epicritic sensation to light touch grossly present bilaterally.  Dermatologic Nails well groomed and normal in appearance. No open wounds. No skin lesions.  Orthopedic: Normal joint ROM without pain or crepitus bilaterally. No visible deformities. Tender to palpation at the calcaneal tuber right. No pain with calcaneal squeeze right. Ankle ROM diminished range of motion right. Silfverskiold Test: positive right.   Radiographs: Taken and reviewed. No acute fractures or dislocations. No evidence of stress fracture.  Plantar heel spur present. Posterior heel spur present.   Assessment:   1. Plantar fasciitis of right foot   2. Pes planus of right foot   3. Deformity of foot, right    Plan:  Patient was evaluated and  treated and all questions answered.  Plantar Fasciitis, right - XR reviewed as above.  - Educated on icing and stretching. Instructions given.  - Injection delivered to the plantar fascia as below. - DME: Plantar Fascial Brace - Pharmacologic management: None  Pes planus semiflexible/deformity of the foot -I discussed with the patient the etiology of pes planus deformity and various treatment options were discussed with the patient.  I believe she'll benefit from  custom-made orthotics to help support the arch of the foot as well as help address the hindfoot motion.  Patient will be scheduled see rec/path for custom-made orthotics.  Procedure: Injection Tendon/Ligament Location: Right plantar fascia at the glabrous junction; medial approach. Skin Prep: alcohol Injectate: 0.5 cc 0.5% marcaine plain, 0.5 cc of 1% Lidocaine, 0.5 cc kenalog 10. Disposition: Patient tolerated procedure well. Injection site dressed with a band-aid.  No follow-ups on file.

## 2019-11-24 ENCOUNTER — Ambulatory Visit: Payer: PRIVATE HEALTH INSURANCE | Attending: Internal Medicine

## 2019-11-24 DIAGNOSIS — Z23 Encounter for immunization: Secondary | ICD-10-CM

## 2019-11-24 NOTE — Progress Notes (Signed)
   Covid-19 Vaccination Clinic  Name:  Debbie Zimmerman    MRN: [id number] DOB: [birth year]  11/24/2019  [name] was observed post Covid-19 immunization for 15 minutes without incident. She was provided with Vaccine Information Sheet and instruction to access the V-Safe system.   [name] was instructed to call 911 with any severe reactions post vaccine: Marland Kitchen Difficulty breathing  . Swelling of face and throat  . A fast heartbeat  . A bad rash all over body  . Dizziness and weakness   Immunizations Administered    Name Date Dose VIS Date Route   Pfizer COVID-19 Vaccine 11/24/2019 12:10 PM 0.3 mL 08/04/2019 Intramuscular   Manufacturer: [employer], [employer]   Lot: [id number]   NDC: [address]-1

## 2019-12-06 ENCOUNTER — Other Ambulatory Visit: Payer: Self-pay

## 2019-12-06 ENCOUNTER — Ambulatory Visit: Payer: PRIVATE HEALTH INSURANCE | Admitting: Podiatry

## 2019-12-06 DIAGNOSIS — M722 Plantar fascial fibromatosis: Secondary | ICD-10-CM

## 2019-12-06 DIAGNOSIS — M2141 Flat foot [pes planus] (acquired), right foot: Secondary | ICD-10-CM | POA: Diagnosis not present

## 2019-12-07 ENCOUNTER — Encounter: Payer: Self-pay | Admitting: Podiatry

## 2019-12-07 NOTE — Progress Notes (Signed)
Subjective:  Patient ID: Debbie Zimmerman, female    DOB: 1965-12-09,  MRN: [id number]  Chief Complaint  Patient presents with  . Plantar Fasciitis    pt is here for a f/u of plantar fasciitis of both feet, pt states that she is feeling alot better, but states that her orthotics have come in as well.    54 y.o. female presents with the above complaint.  Patient is following up for right plantar fasciitis.  Patient states the injection helped her a lot better.  She states the pain now comes and goes as opposed to being on occasionally.  She states that she still has mild discomfort however she has improved greater than 80%.  The stretching has helped.  The injection has helped.  Pain scale is 4 out of 10.  She is also here today to pick up orthotics.  Review of Systems: Negative except as noted in the HPI. Denies N/V/F/Ch.  Past Medical History:  Diagnosis Date  . Chest discomfort   . DDD (degenerative disc disease), lumbar 03/29/2019  . Epigastric pain 03/29/2019  . History of anemia 11/27/2016  . Incontinence    [facility]- [facility]  . Lightheaded   . OAB (overactive bladder) 03/29/2019  . Pain in lower back   . Plantar fasciitis 03/29/2019  . Slow transit constipation 03/29/2019    Current Outpatient Medications:  .  mirabegron ER (MYRBETRIQ) 50 MG TB24 tablet, Take 50 mg by mouth daily., Disp: , Rfl:  .  NONFORMULARY OR COMPOUNDED ITEM, [facility]: Achilles Tendonitis - Baclofen 2%, Bupivacaine 1%, Gabapentin 6%, Ibuprofen 3%, Pentoxifylline 3%, apply 1-2 grams to affected area 3-4 times a day. (omit Diclofenac), Disp: 100 each, Rfl: 5 .  polyethylene glycol (MIRALAX / GLYCOLAX) 17 g packet, Take 17 g by mouth daily., Disp: , Rfl:   Social History   Tobacco Use  Smoking Status Never Smoker  Smokeless Tobacco Never Used    Allergies  Allergen Reactions  . Codeine Other (See Comments)    Other reaction(s): passes out PASSES OUT  . Diclofenac     Pt stated, "My  fingers felt tingly and I got chest pain"  . Trimethoprim-Sulfamethoxazole [Sulfamethoxazole-Trimethoprim] Other (See Comments)    Headache   Objective:  There were no vitals filed for this visit. There is no height or weight on file to calculate BMI. Constitutional Well developed. Well nourished.  Vascular Dorsalis pedis pulses palpable bilaterally. Posterior tibial pulses palpable bilaterally. Capillary refill normal to all digits.  No cyanosis or clubbing noted. Pedal hair growth normal.  Neurologic Normal speech. Oriented to person, place, and time. Epicritic sensation to light touch grossly present bilaterally.  Dermatologic Nails well groomed and normal in appearance. No open wounds. No skin lesions.  Orthopedic: Normal joint ROM without pain or crepitus bilaterally. No visible deformities. Mild tender to palpation at the calcaneal tuber right. No pain with calcaneal squeeze right. Ankle ROM diminished range of motion right. Silfverskiold Test: positive right.   Radiographs: Taken and reviewed. No acute fractures or dislocations. No evidence of stress fracture.  Plantar heel spur present. Posterior heel spur present.   Assessment:   1. Pes planus of right foot   2. Plantar fasciitis of right foot    Plan:  Patient was evaluated and treated and all questions answered.  Plantar Fasciitis, right - XR reviewed as above.  - Educated on icing and stretching. Instructions given.  -Second injection delivered to the plantar fascia as below. - DME: Plantar  Fascial Brace - Pharmacologic management: None  Pes planus semiflexible/deformity of the foot -I discussed with the patient the etiology of pes planus deformity and various treatment options were discussed with the patient.  I believe she'll benefit from custom-made orthotics to help support the arch of the foot as well as help address the hindfoot motion.  -Patient received her orthotics and was given to her during  today's office visit.  The break-in period was extensively discussed with the patient.  If there is any issues with the orthotics I have asked the patient to come back and see me.  Procedure: Injection Tendon/Ligament Location: Right plantar fascia at the glabrous junction; medial approach. Skin Prep: alcohol Injectate: 0.5 cc 0.5% marcaine plain, 0.5 cc of 1% Lidocaine, 0.5 cc kenalog 10. Disposition: Patient tolerated procedure well. Injection site dressed with a band-aid.  No follow-ups on file.

## 2019-12-18 ENCOUNTER — Ambulatory Visit: Payer: PRIVATE HEALTH INSURANCE | Attending: Internal Medicine

## 2019-12-18 DIAGNOSIS — Z23 Encounter for immunization: Secondary | ICD-10-CM

## 2019-12-18 NOTE — Progress Notes (Signed)
   Covid-19 Vaccination Clinic  Name:  Debbie Zimmerman    MRN: [id number] DOB: 01-11-1966  12/18/2019  Debbie Zimmerman was observed post Covid-19 immunization for 15 minutes without incident. She was provided with Vaccine Information Sheet and instruction to access the V-Safe system.   Debbie Zimmerman was instructed to call 911 with any severe reactions post vaccine: Marland Kitchen Difficulty breathing  . Swelling of face and throat  . A fast heartbeat  . A bad rash all over body  . Dizziness and weakness   Immunizations Administered    Name Date Dose VIS Date Route   Pfizer COVID-19 Vaccine 12/18/2019  1:23 PM 0.3 mL 10/18/2018 Intramuscular   Manufacturer: [employer], [employer]   Lot: [id number]   NDC: [address]-6

## 2019-12-25 ENCOUNTER — Telehealth: Payer: Self-pay | Admitting: Podiatry

## 2019-12-25 NOTE — Telephone Encounter (Signed)
Pt left message for me to call to see if insurance has paid for the orthotics.   I returned call and it looks like they paid all but 50.00 which is the copay.

## 2020-01-10 IMAGING — MG MM  DIGITAL DIAGNOSTIC BREAST BILAT IMPLANT W/ TOMO W/ CAD
8 of 14 series · 8 of 30 positions shown · non-contrast
Comparison: Previous exam(s).

CLINICAL DATA: Patient presents with right breast pain. Patient's
referring clinician reported enlarged right axillary lymph nodes.

EXAM:
DIGITAL DIAGNOSTIC BILATERAL MAMMOGRAM WITH IMPLANTS, CAD AND TOMO
ULTRASOUND RIGHT BREAST
The patient has retropectoral implants. Standard and implant
displaced views were performed.

[R CC]
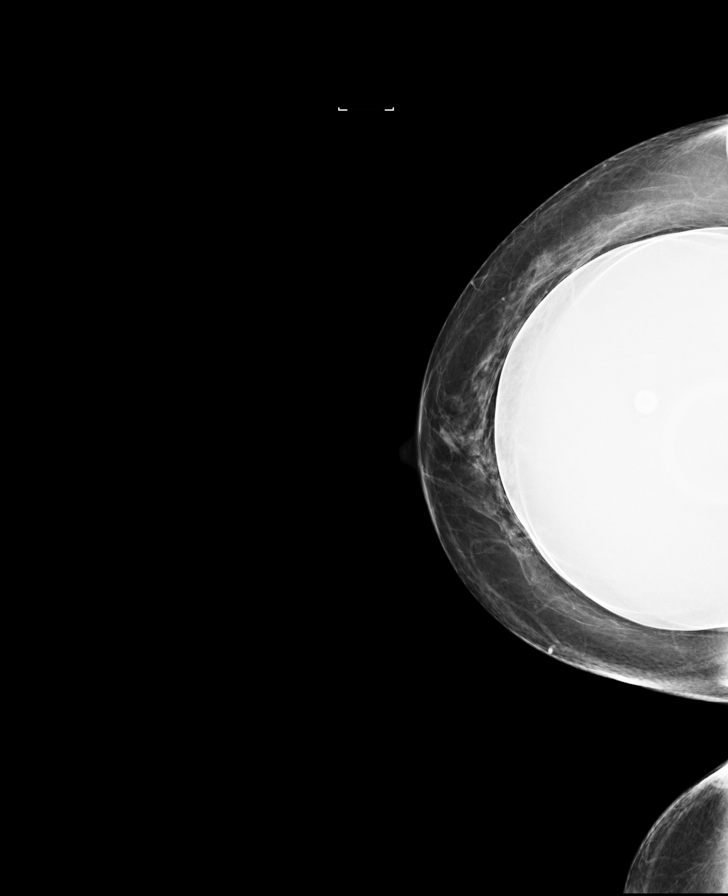

[L MLO (1 of 3)]
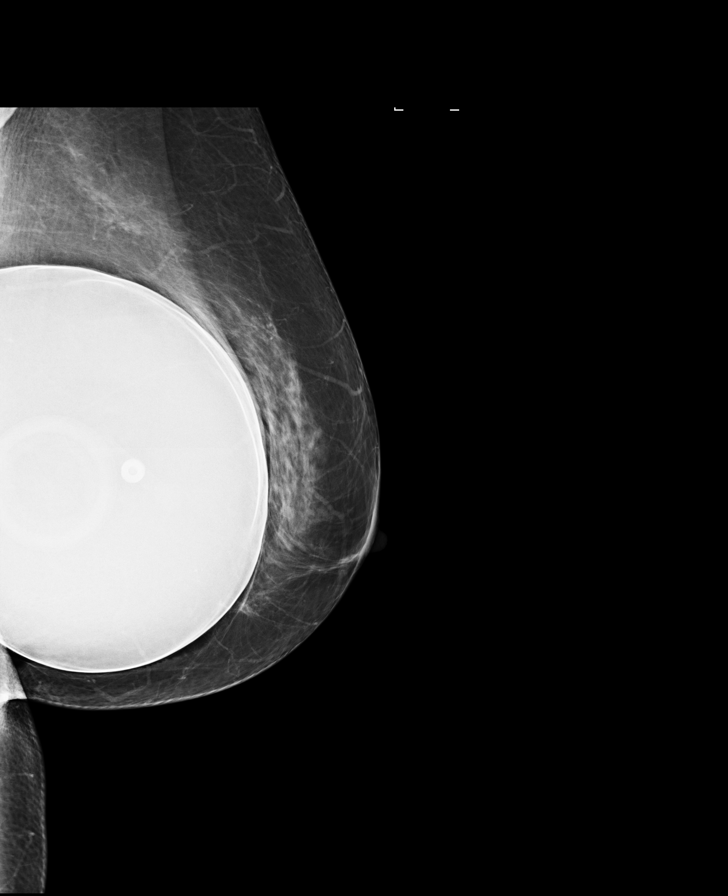

[R MLO]
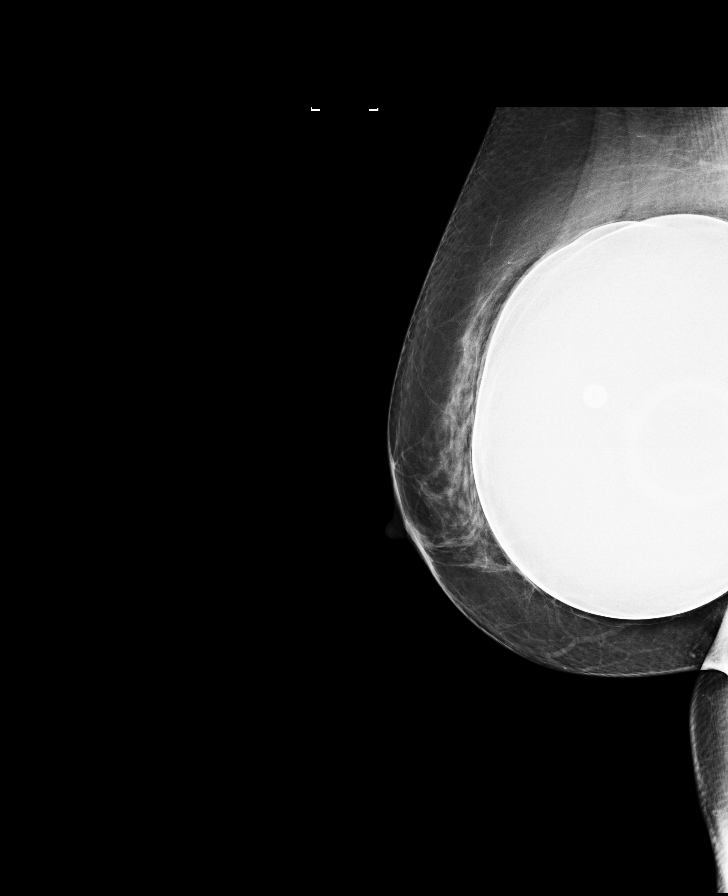

[L MLO (2 of 3)]
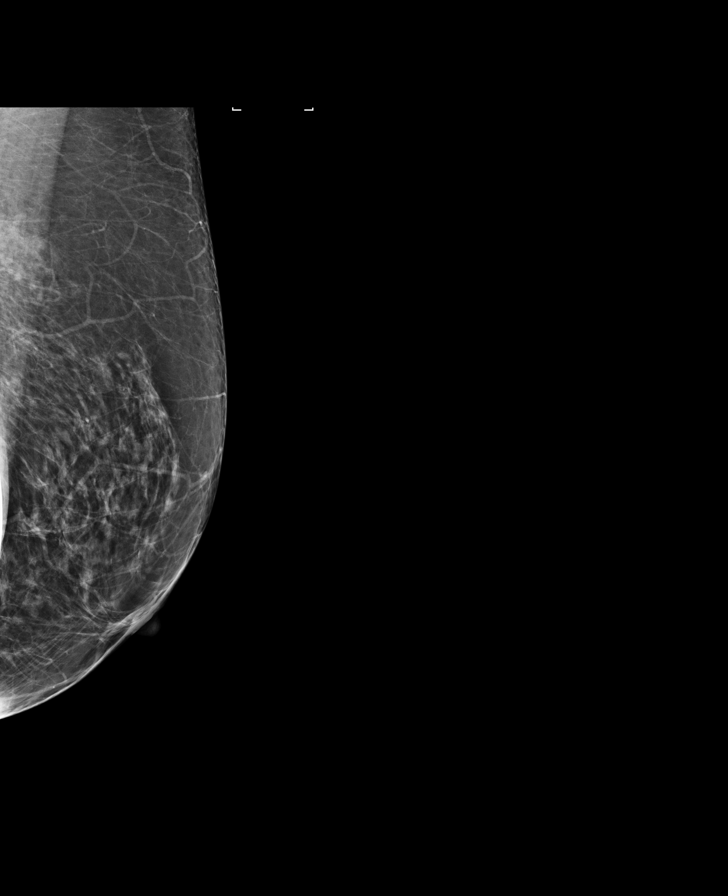

[L MLO (3 of 3)]
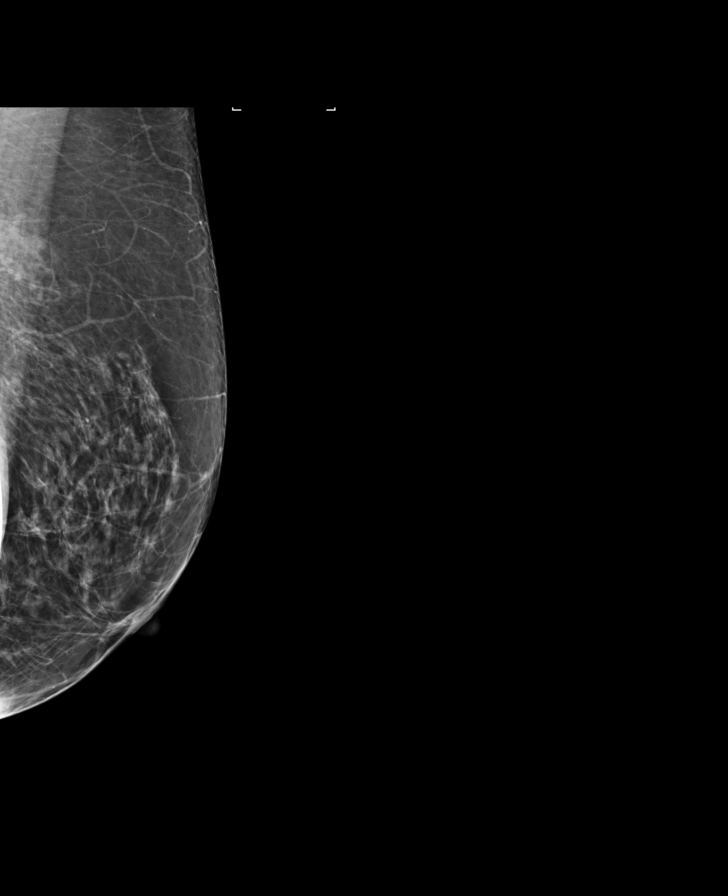

[L CC]
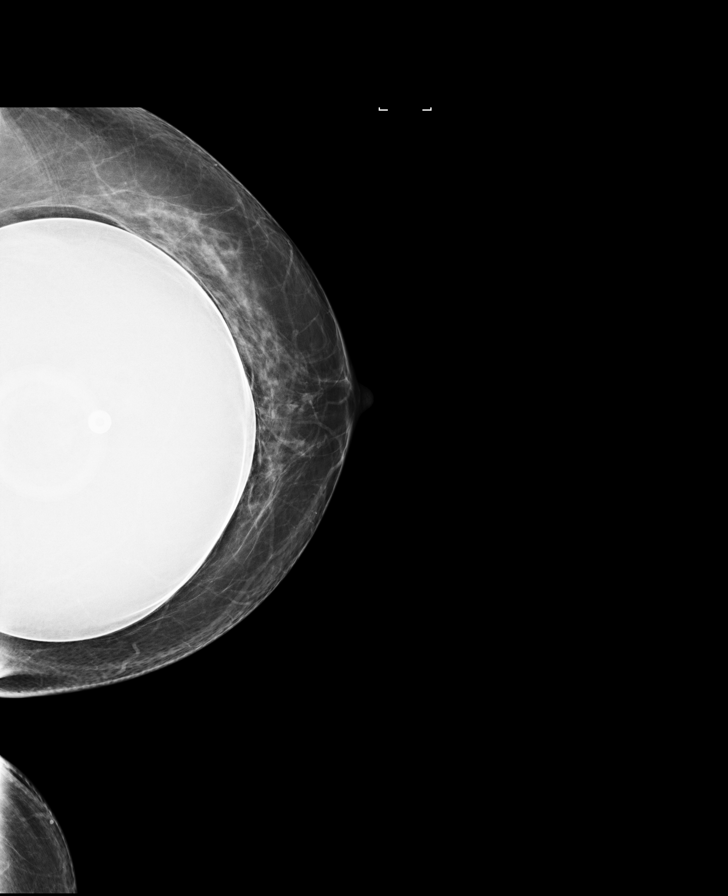

[L CC synth-2D]
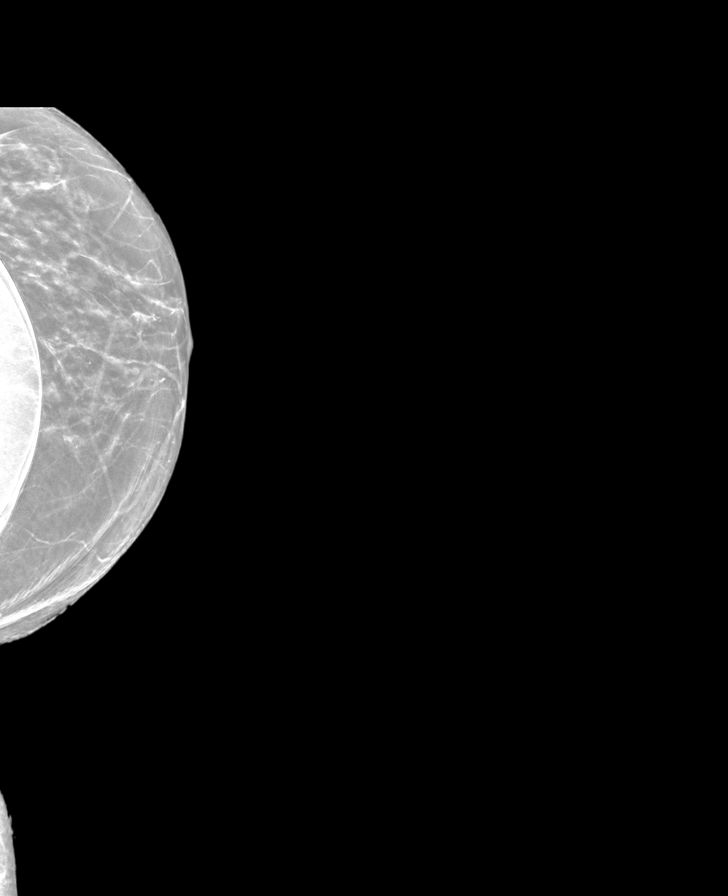

[R MLO synth-2D]
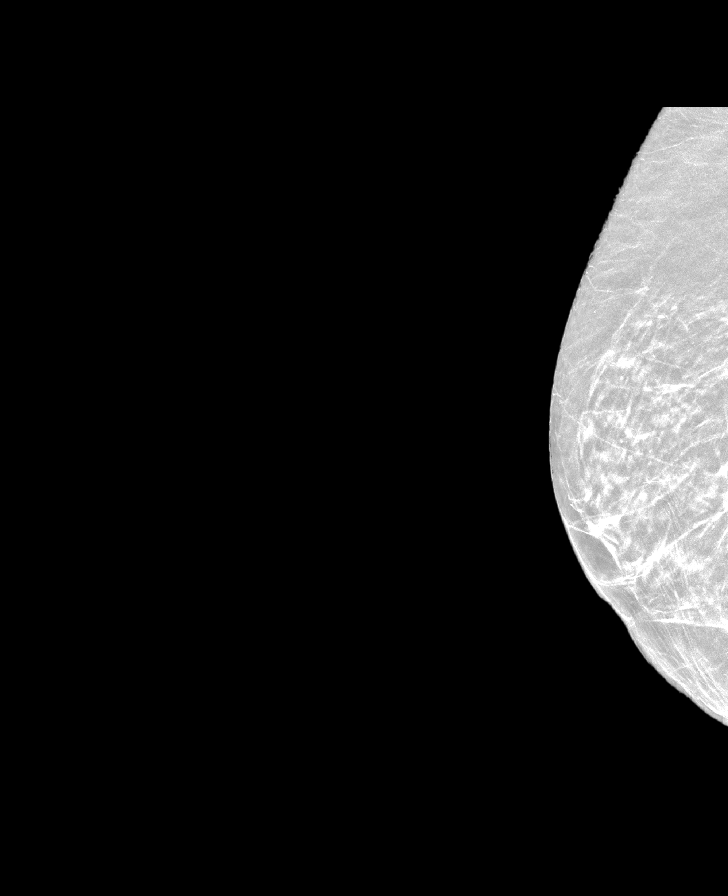

[8 of 30 positions shown; findings below may reference images not displayed]

ACR Breast Density Category b: There are scattered areas of
fibroglandular density.
FINDINGS: There are no masses, areas of architectural distortion, areas of
significant asymmetry or suspicious calcifications. Bilateral
retropectoral saline implants are intact and stable.

On physical exam, no mass is palpated in the upper or lateral right
breast. No mass is palpated in the right axilla.

Targeted ultrasound is performed, showing normal fibroglandular
tissue throughout the upper and lateral aspects of the right breast.
No masses or suspicious lesions.

Sonographic evaluation of the right axilla shows no enlarged or
abnormal lymph nodes. There are no masses.

Mammographic images were processed with CAD.
IMPRESSION: 1. Normal exam.  No evidence of breast malignancy.

RECOMMENDATION:
Screening mammogram in one year.(Code:[id number])

I have discussed the findings and recommendations with the patient.
If applicable, a reminder letter will be sent to the patient
regarding the next appointment.

BI-RADS CATEGORY  1: Negative.

## 2020-02-09 ENCOUNTER — Other Ambulatory Visit: Payer: Self-pay

## 2020-02-09 ENCOUNTER — Ambulatory Visit (INDEPENDENT_AMBULATORY_CARE_PROVIDER_SITE_OTHER): Payer: PRIVATE HEALTH INSURANCE | Admitting: Podiatry

## 2020-02-09 DIAGNOSIS — M7731 Calcaneal spur, right foot: Secondary | ICD-10-CM

## 2020-02-09 DIAGNOSIS — M722 Plantar fascial fibromatosis: Secondary | ICD-10-CM | POA: Diagnosis not present

## 2020-02-13 ENCOUNTER — Encounter: Payer: Self-pay | Admitting: Podiatry

## 2020-02-13 NOTE — Progress Notes (Signed)
Subjective:  Patient ID: Debbie Zimmerman, female    DOB: 30-Oct-1965,  MRN: [id number]  No chief complaint on file.   54 y.o. female presents with the above complaint.  Patient presents with a follow-up of right plantar fasciitis.  She states that she had a good relief for couple of months however her plantar fasciitis has returned.  She states the inserts do help.  She states that she may need another shot to help decrease some of the inflammatory component of pain.  She denies any other acute complaints.  The right side is the one that is hurting the most.  The left side might be due to compensation if there was any.  She denies any other acute complaints.   Review of Systems: Negative except as noted in the HPI. Denies N/V/F/Ch.  Past Medical History:  Diagnosis Date  . Chest discomfort   . DDD (degenerative disc disease), lumbar 03/29/2019  . Epigastric pain 03/29/2019  . History of anemia 11/27/2016  . Incontinence    [facility]ab- [facility]  . Lightheaded   . OAB (overactive bladder) 03/29/2019  . Pain in lower back   . Plantar fasciitis 03/29/2019  . Slow transit constipation 03/29/2019    Current Outpatient Medications:  .  mirabegron ER (MYRBETRIQ) 50 MG TB24 tablet, Take 50 mg by mouth daily., Disp: , Rfl:  .  NONFORMULARY OR COMPOUNDED ITEM, [facility]: Achilles Tendonitis - Baclofen 2%, Bupivacaine 1%, Gabapentin 6%, Ibuprofen 3%, Pentoxifylline 3%, apply 1-2 grams to affected area 3-4 times a day. (omit Diclofenac), Disp: 100 each, Rfl: 5 .  polyethylene glycol (MIRALAX / GLYCOLAX) 17 g packet, Take 17 g by mouth daily., Disp: , Rfl:   Social History   Tobacco Use  Smoking Status Never Smoker  Smokeless Tobacco Never Used    Allergies  Allergen Reactions  . Codeine Other (See Comments)    Other reaction(s): passes out PASSES OUT  . Diclofenac     Pt stated, "My fingers felt tingly and I got chest pain"  . Trimethoprim-Sulfamethoxazole  [Sulfamethoxazole-Trimethoprim] Other (See Comments)    Headache   Objective:  There were no vitals filed for this visit. There is no height or weight on file to calculate BMI. Constitutional Well developed. Well nourished.  Vascular Dorsalis pedis pulses palpable bilaterally. Posterior tibial pulses palpable bilaterally. Capillary refill normal to all digits.  No cyanosis or clubbing noted. Pedal hair growth normal.  Neurologic Normal speech. Oriented to person, place, and time. Epicritic sensation to light touch grossly present bilaterally.  Dermatologic Nails well groomed and normal in appearance. No open wounds. No skin lesions.  Orthopedic: Normal joint ROM without pain or crepitus bilaterally. No visible deformities. Tender to palpation at the calcaneal tuber right. No pain with calcaneal squeeze right. Ankle ROM diminished range of motion right. Silfverskiold Test: positive right.   Radiographs: Taken and reviewed. No acute fractures or dislocations. No evidence of stress fracture.  Plantar heel spur present. Posterior heel spur present.   Assessment:   1. Plantar fasciitis of right foot   2. Heel spur, right    Plan:  Patient was evaluated and treated and all questions answered.  Plantar Fasciitis, right - XR reviewed as above.  - Educated on icing and stretching. Instructions given.  - Injection delivered to the plantar fascia as below. - DME: Patient will place himself back in the brace. - Pharmacologic management: None  Procedure: Injection Tendon/Ligament Location: Right plantar fascia at the glabrous junction; medial approach.  Skin Prep: alcohol Injectate: 0.5 cc 0.5% marcaine plain, 0.5 cc of 1% Lidocaine, 0.5 cc kenalog 10. Disposition: Patient tolerated procedure well. Injection site dressed with a band-aid.  No follow-ups on file.

## 2020-04-04 ENCOUNTER — Other Ambulatory Visit: Payer: Self-pay | Admitting: Family Medicine

## 2020-04-04 DIAGNOSIS — Z1231 Encounter for screening mammogram for malignant neoplasm of breast: Secondary | ICD-10-CM

## 2020-05-08 ENCOUNTER — Ambulatory Visit: Payer: PRIVATE HEALTH INSURANCE | Admitting: Cardiology

## 2020-05-24 ENCOUNTER — Ambulatory Visit: Payer: PRIVATE HEALTH INSURANCE | Admitting: Cardiology

## 2020-05-27 ENCOUNTER — Other Ambulatory Visit: Payer: Self-pay

## 2020-05-27 ENCOUNTER — Ambulatory Visit
Admission: RE | Admit: 2020-05-27 | Discharge: 2020-05-27 | Disposition: A | Discharge status: Home / Self Care | Payer: PRIVATE HEALTH INSURANCE | Source: Ambulatory Visit | Attending: Family Medicine | Admitting: Family Medicine

## 2020-05-27 DIAGNOSIS — Z1231 Encounter for screening mammogram for malignant neoplasm of breast: Secondary | ICD-10-CM

## 2020-08-02 ENCOUNTER — Ambulatory Visit: Payer: PRIVATE HEALTH INSURANCE | Admitting: Cardiology

## 2021-04-23 ENCOUNTER — Other Ambulatory Visit: Payer: Self-pay | Admitting: Family Medicine

## 2021-04-23 DIAGNOSIS — Z1231 Encounter for screening mammogram for malignant neoplasm of breast: Secondary | ICD-10-CM

## 2021-05-30 ENCOUNTER — Other Ambulatory Visit: Payer: Self-pay

## 2021-05-30 ENCOUNTER — Ambulatory Visit
Admission: RE | Admit: 2021-05-30 | Discharge: 2021-05-30 | Disposition: A | Discharge status: Home / Self Care | Payer: PRIVATE HEALTH INSURANCE | Source: Ambulatory Visit | Attending: Family Medicine | Admitting: Family Medicine

## 2021-05-30 DIAGNOSIS — Z1231 Encounter for screening mammogram for malignant neoplasm of breast: Secondary | ICD-10-CM

## 2021-09-11 ENCOUNTER — Ambulatory Visit
Admission: RE | Admit: 2021-09-11 | Discharge: 2021-09-11 | Disposition: A | Discharge status: Home / Self Care | Payer: PRIVATE HEALTH INSURANCE | Source: Ambulatory Visit | Attending: Sports Medicine | Admitting: Sports Medicine

## 2021-09-11 ENCOUNTER — Other Ambulatory Visit: Payer: Self-pay | Admitting: Sports Medicine

## 2021-09-11 DIAGNOSIS — M25511 Pain in right shoulder: Secondary | ICD-10-CM

## 2022-02-25 ENCOUNTER — Other Ambulatory Visit: Payer: Self-pay | Admitting: Family Medicine

## 2022-02-25 DIAGNOSIS — Z780 Asymptomatic menopausal state: Secondary | ICD-10-CM

## 2022-06-04 ENCOUNTER — Other Ambulatory Visit: Payer: Self-pay | Admitting: Family Medicine

## 2022-06-04 DIAGNOSIS — Z1231 Encounter for screening mammogram for malignant neoplasm of breast: Secondary | ICD-10-CM

## 2022-07-14 ENCOUNTER — Ambulatory Visit: Payer: PRIVATE HEALTH INSURANCE

## 2022-09-04 ENCOUNTER — Ambulatory Visit: Payer: PRIVATE HEALTH INSURANCE

## 2022-10-23 ENCOUNTER — Ambulatory Visit
Admission: RE | Admit: 2022-10-23 | Discharge: 2022-10-23 | Disposition: A | Discharge status: Home / Self Care | Payer: PRIVATE HEALTH INSURANCE | Source: Ambulatory Visit | Attending: Family Medicine | Admitting: Family Medicine

## 2022-10-23 DIAGNOSIS — Z1231 Encounter for screening mammogram for malignant neoplasm of breast: Secondary | ICD-10-CM

## 2023-07-29 ENCOUNTER — Other Ambulatory Visit: Payer: Self-pay | Admitting: Gastroenterology

## 2023-07-29 DIAGNOSIS — R1013 Epigastric pain: Secondary | ICD-10-CM

## 2023-07-30 ENCOUNTER — Ambulatory Visit
Admission: RE | Admit: 2023-07-30 | Discharge: 2023-07-30 | Disposition: A | Discharge status: Home / Self Care | Payer: No Typology Code available for payment source | Source: Ambulatory Visit | Attending: Gastroenterology | Admitting: Gastroenterology

## 2023-07-30 DIAGNOSIS — R1013 Epigastric pain: Secondary | ICD-10-CM

## 2023-08-12 ENCOUNTER — Ambulatory Visit: Payer: Self-pay | Admitting: General Surgery

## 2023-08-12 DIAGNOSIS — K8020 Calculus of gallbladder without cholecystitis without obstruction: Secondary | ICD-10-CM

## 2023-08-12 NOTE — Progress Notes (Addendum)
Surgical Instructions   Your procedure is scheduled on Thursday, August 19, 2023. Report to [facility] Main Entrance "A" at 5:30 A.M., then check in with the Admitting office. Any questions or running late day of surgery: call [phone]  Questions prior to your surgery date: call [phone], Monday-Friday, 8am-4pm. If you experience any cold or flu symptoms such as cough, fever, chills, shortness of breath, etc. between now and your scheduled surgery, please notify us at the above number.     Remember:  Do not eat or drink after midnight the night before your surgery   Take these medicines the morning of surgery with A SIP OF WATER  GEMTESA  omeprazole (PRILOSEC)   One week prior to surgery, STOP taking any Aspirin (unless otherwise instructed by your surgeon) Aleve, Naproxen, Ibuprofen, Motrin, Advil, Goody's, BC's, all herbal medications, fish oil, and non-prescription vitamins.                     Do NOT Smoke (Tobacco/Vaping) for 24 hours prior to your procedure.  If you use a CPAP at night, you may bring your mask/headgear for your overnight stay.   You will be asked to remove any contacts, glasses, piercing's, hearing aid's, dentures/partials prior to surgery. Please bring cases for these items if needed.    Patients discharged the day of surgery will not be allowed to drive home, and someone needs to stay with them for 24 hours.  SURGICAL WAITING ROOM VISITATION Patients may have no more than 2 support people in the waiting area - these visitors may rotate.   Pre-op nurse will coordinate an appropriate time for 1 ADULT support person, who may not rotate, to accompany patient in pre-op.  Children under the age of 56 must have an adult with them who is not the patient and must remain in the main waiting area with an adult.  If the patient needs to stay at the hospital during part of their recovery, the visitor guidelines for inpatient rooms apply.  Please refer to the  [facility] website for the visitor guidelines for any additional information.   If you received a COVID test during your pre-op visit  it is requested that you wear a mask when out in public, stay away from anyone that may not be feeling well and notify your surgeon if you develop symptoms. If you have been in contact with anyone that has tested positive in the last 10 days please notify you surgeon.      Pre-operative CHG Bathing Instructions   You can play a key role in reducing the risk of infection after surgery. Your skin needs to be as free of germs as possible. You can reduce the number of germs on your skin by washing with CHG (chlorhexidine gluconate) soap before surgery. CHG is an antiseptic soap that kills germs and continues to kill germs even after washing.   DO NOT use if you have an allergy to chlorhexidine/CHG or antibacterial soaps. If your skin becomes reddened or irritated, stop using the CHG and notify one of our RNs at [phone].              TAKE A SHOWER THE NIGHT BEFORE SURGERY AND THE DAY OF SURGERY    Please keep in mind the following:  DO NOT shave, including legs and underarms, 48 hours prior to surgery.   You may shave your face before/day of surgery.  Place clean sheets on your bed the night before  surgery Use a clean washcloth (not used since being washed) for each shower. DO NOT sleep with pet's night before surgery.  CHG Shower Instructions:  Wash your face and private area with normal soap. If you choose to wash your hair, wash first with your normal shampoo.  After you use shampoo/soap, rinse your hair and body thoroughly to remove shampoo/soap residue.  Turn the water OFF and apply half the bottle of CHG soap to a CLEAN washcloth.  Apply CHG soap ONLY FROM YOUR NECK DOWN TO YOUR TOES (washing for 3-5 minutes)  DO NOT use CHG soap on face, private areas, open wounds, or sores.  Pay special attention to the area where your surgery is being performed.   If you are having back surgery, having someone wash your back for you may be helpful. Wait 2 minutes after CHG soap is applied, then you may rinse off the CHG soap.  Pat dry with a clean towel  Put on clean pajamas    Additional instructions for the day of surgery: DO NOT APPLY any lotions, deodorants or perfumes.   Do not wear jewelry or makeup Do not wear nail polish, gel polish, artificial nails, or any other type of covering on natural nails (fingers and toes) Do not bring valuables to the hospital. [facility] is not responsible for valuables/personal belongings. Put on clean/comfortable clothes.  Please brush your teeth.  Ask your nurse before applying any prescription medications to the skin.

## 2023-08-13 ENCOUNTER — Encounter (HOSPITAL_COMMUNITY): Payer: Self-pay | Admitting: *Deleted

## 2023-08-13 ENCOUNTER — Other Ambulatory Visit: Payer: Self-pay

## 2023-08-13 ENCOUNTER — Encounter (HOSPITAL_COMMUNITY)
Admission: RE | Admit: 2023-08-13 | Discharge: 2023-08-13 | Disposition: A | Discharge status: Home / Self Care | Payer: No Typology Code available for payment source | Source: Ambulatory Visit | Attending: General Surgery | Admitting: General Surgery

## 2023-08-13 VITALS — BP 101/68 | HR 74 | Temp 97.9°F | Resp 18 | Ht <= 58 in | Wt 140.7 lb

## 2023-08-13 DIAGNOSIS — Z01818 Encounter for other preprocedural examination: Secondary | ICD-10-CM | POA: Diagnosis present

## 2023-08-13 HISTORY — DX: Gastro-esophageal reflux disease without esophagitis: K21.9

## 2023-08-13 LAB — CBC
HCT: 37.5 % (ref 36.0–46.0)
Hemoglobin: 12.2 g/dL (ref 12.0–15.0)
MCH: 30 pg (ref 26.0–34.0)
MCHC: 32.5 g/dL (ref 30.0–36.0)
MCV: 92.4 fL (ref 80.0–100.0)
Platelets: 238 10*3/uL (ref 150–400)
RBC: 4.06 MIL/uL (ref 3.87–5.11)
RDW: 12.2 % (ref 11.5–15.5)
WBC: 7 10*3/uL (ref 4.0–10.5)
nRBC: 0 % (ref 0.0–0.2)

## 2023-08-13 NOTE — Progress Notes (Signed)
PCP - Jarrett Soho, PA-C Cardiologist - denies  PPM/ICD - denies   Chest x-ray - denies EKG - 03/30/19 Stress Test - 10/19/19 ECHO - denies Cardiac Cath - denies  Sleep Study - denies   DM- denies  Last dose of GLP1 agonist-  n/a   ASA/Blood Thinner Instructions: n/a   ERAS Protcol - no, NPO   COVID TEST- n/a   Anesthesia review: no  Patient denies shortness of breath, fever, cough and chest pain at PAT appointment   All instructions explained to the patient, with a verbal understanding of the material. Patient agrees to go over the instructions while at home for a better understanding.  The opportunity to ask questions was provided.

## 2023-08-19 ENCOUNTER — Ambulatory Visit (HOSPITAL_COMMUNITY): Payer: No Typology Code available for payment source | Admitting: Anesthesiology

## 2023-08-19 ENCOUNTER — Encounter (HOSPITAL_COMMUNITY)
Admission: RE | Disposition: A | Discharge status: Home / Self Care | Payer: Self-pay | Source: Ambulatory Visit | Attending: General Surgery

## 2023-08-19 ENCOUNTER — Ambulatory Visit (HOSPITAL_COMMUNITY)
Admission: RE | Admit: 2023-08-19 | Discharge: 2023-08-19 | Disposition: A | Discharge status: Home / Self Care | Payer: No Typology Code available for payment source | Source: Ambulatory Visit | Attending: General Surgery | Admitting: General Surgery

## 2023-08-19 ENCOUNTER — Ambulatory Visit (HOSPITAL_BASED_OUTPATIENT_CLINIC_OR_DEPARTMENT_OTHER): Payer: No Typology Code available for payment source | Admitting: Anesthesiology

## 2023-08-19 ENCOUNTER — Ambulatory Visit (HOSPITAL_COMMUNITY): Payer: No Typology Code available for payment source

## 2023-08-19 ENCOUNTER — Other Ambulatory Visit: Payer: Self-pay

## 2023-08-19 DIAGNOSIS — K8010 Calculus of gallbladder with chronic cholecystitis without obstruction: Secondary | ICD-10-CM | POA: Diagnosis present

## 2023-08-19 HISTORY — PX: CHOLECYSTECTOMY: SHX55

## 2023-08-19 SURGERY — LAPAROSCOPIC CHOLECYSTECTOMY
Anesthesia: General | Site: Abdomen

## 2023-08-19 MED ORDER — CELECOXIB 200 MG PO CAPS
200.0000 mg | ORAL_CAPSULE | ORAL | Status: AC
  Administered 2023-08-19: 200 mg via ORAL

## 2023-08-19 MED ORDER — LIDOCAINE 2% (20 MG/ML) 5 ML SYRINGE
INTRAMUSCULAR | Status: DC | PRN
  Administered 2023-08-19: 100 mg via INTRAVENOUS

## 2023-08-19 MED ORDER — CHLORHEXIDINE GLUCONATE CLOTH 2 % EX PADS: 6.0000 | MEDICATED_PAD | Freq: Once | CUTANEOUS | Status: DC

## 2023-08-19 MED ORDER — ONDANSETRON HCL 4 MG/2ML IJ SOLN
INTRAMUSCULAR | Status: AC
  Filled 2023-08-19: qty 2

## 2023-08-19 MED ORDER — SODIUM CHLORIDE 0.9 % IV SOLN
2.0000 g | INTRAVENOUS | Status: AC
  Administered 2023-08-19: 2 g via INTRAVENOUS

## 2023-08-19 MED ORDER — DROPERIDOL 2.5 MG/ML IJ SOLN
INTRAMUSCULAR | Status: AC
  Filled 2023-08-19: qty 2

## 2023-08-19 MED ORDER — FENTANYL CITRATE (PF) 100 MCG/2ML IJ SOLN
INTRAMUSCULAR | Status: AC
  Filled 2023-08-19: qty 2

## 2023-08-19 MED ORDER — PHENYLEPHRINE 80 MCG/ML (10ML) SYRINGE FOR IV PUSH (FOR BLOOD PRESSURE SUPPORT)
PREFILLED_SYRINGE | INTRAVENOUS | Status: DC | PRN
  Administered 2023-08-19: 160 ug via INTRAVENOUS

## 2023-08-19 MED ORDER — BUPIVACAINE-EPINEPHRINE 0.25% -1:200000 IJ SOLN
INTRAMUSCULAR | Status: DC | PRN
  Administered 2023-08-19: 10 mL

## 2023-08-19 MED ORDER — DEXAMETHASONE SODIUM PHOSPHATE 10 MG/ML IJ SOLN
INTRAMUSCULAR | Status: DC | PRN
  Administered 2023-08-19: 10 mg via INTRAVENOUS

## 2023-08-19 MED ORDER — HEPARIN SODIUM (PORCINE) 5000 UNIT/ML IJ SOLN
5000.0000 [IU] | Freq: Once | INTRAMUSCULAR | Status: AC
  Administered 2023-08-19: 5000 [IU] via SUBCUTANEOUS
  Filled 2023-08-19: qty 1

## 2023-08-19 MED ORDER — DROPERIDOL 2.5 MG/ML IJ SOLN
0.6250 mg | Freq: Once | INTRAMUSCULAR | Status: AC
  Administered 2023-08-19: 0.625 mg via INTRAVENOUS

## 2023-08-19 MED ORDER — LIDOCAINE 2% (20 MG/ML) 5 ML SYRINGE
INTRAMUSCULAR | Status: AC
  Filled 2023-08-19: qty 5

## 2023-08-19 MED ORDER — PROPOFOL 10 MG/ML IV BOLUS
INTRAVENOUS | Status: DC | PRN
  Administered 2023-08-19: 90 mg via INTRAVENOUS

## 2023-08-19 MED ORDER — LACTATED RINGERS IV SOLN: INTRAVENOUS | Status: DC

## 2023-08-19 MED ORDER — ROCURONIUM BROMIDE 10 MG/ML (PF) SYRINGE
PREFILLED_SYRINGE | INTRAVENOUS | Status: AC
  Filled 2023-08-19: qty 10

## 2023-08-19 MED ORDER — DEXAMETHASONE SODIUM PHOSPHATE 10 MG/ML IJ SOLN
INTRAMUSCULAR | Status: AC
  Filled 2023-08-19: qty 1

## 2023-08-19 MED ORDER — 0.9 % SODIUM CHLORIDE (POUR BTL) OPTIME
TOPICAL | Status: DC | PRN
  Administered 2023-08-19: 1000 mL

## 2023-08-19 MED ORDER — ORAL CARE MOUTH RINSE
15.0000 mL | Freq: Once | OROMUCOSAL | Status: AC
Start: 2023-08-19 — End: 2023-08-19

## 2023-08-19 MED ORDER — CHLORHEXIDINE GLUCONATE 0.12 % MT SOLN
15.0000 mL | Freq: Once | OROMUCOSAL | Status: AC
  Administered 2023-08-19: 15 mL via OROMUCOSAL

## 2023-08-19 MED ORDER — SODIUM CHLORIDE 0.9 % IR SOLN
Status: DC | PRN
  Administered 2023-08-19: 1000 mL

## 2023-08-19 MED ORDER — SUGAMMADEX SODIUM 200 MG/2ML IV SOLN
INTRAVENOUS | Status: DC | PRN
  Administered 2023-08-19: 200 mg via INTRAVENOUS

## 2023-08-19 MED ORDER — OXYCODONE HCL 5 MG/5ML PO SOLN: 5.0000 mg | Freq: Once | ORAL | Status: DC | PRN

## 2023-08-19 MED ORDER — HYDROMORPHONE HCL 1 MG/ML IJ SOLN
INTRAMUSCULAR | Status: DC | PRN
  Administered 2023-08-19: .5 mg via INTRAVENOUS

## 2023-08-19 MED ORDER — FENTANYL CITRATE (PF) 250 MCG/5ML IJ SOLN
INTRAMUSCULAR | Status: DC | PRN
  Administered 2023-08-19 (×3): 50 ug via INTRAVENOUS

## 2023-08-19 MED ORDER — ROCURONIUM BROMIDE 10 MG/ML (PF) SYRINGE
PREFILLED_SYRINGE | INTRAVENOUS | Status: DC | PRN
  Administered 2023-08-19: 50 mg via INTRAVENOUS

## 2023-08-19 MED ORDER — ONDANSETRON HCL 4 MG/2ML IJ SOLN
INTRAMUSCULAR | Status: DC | PRN
  Administered 2023-08-19: 4 mg via INTRAVENOUS

## 2023-08-19 MED ORDER — HYDROMORPHONE HCL 1 MG/ML IJ SOLN
INTRAMUSCULAR | Status: AC
  Filled 2023-08-19: qty 0.5

## 2023-08-19 MED ORDER — PROPOFOL 10 MG/ML IV BOLUS
INTRAVENOUS | Status: AC
  Filled 2023-08-19: qty 20

## 2023-08-19 MED ORDER — ONDANSETRON HCL 4 MG/2ML IJ SOLN
4.0000 mg | Freq: Once | INTRAMUSCULAR | Status: AC | PRN
  Administered 2023-08-19: 4 mg via INTRAVENOUS

## 2023-08-19 MED ORDER — ACETAMINOPHEN 500 MG PO TABS
1000.0000 mg | ORAL_TABLET | ORAL | Status: AC
  Administered 2023-08-19: 1000 mg via ORAL
  Filled 2023-08-19: qty 2

## 2023-08-19 MED ORDER — OXYCODONE HCL 5 MG PO TABS: 5.0000 mg | ORAL_TABLET | Freq: Four times a day (QID) | ORAL | 0 refills | Status: AC | PRN

## 2023-08-19 MED ORDER — BUPIVACAINE-EPINEPHRINE (PF) 0.25% -1:200000 IJ SOLN
INTRAMUSCULAR | Status: AC
  Filled 2023-08-19: qty 30

## 2023-08-19 MED ORDER — OXYCODONE HCL 5 MG PO TABS: 5.0000 mg | ORAL_TABLET | Freq: Once | ORAL | Status: DC | PRN

## 2023-08-19 MED ORDER — FENTANYL CITRATE (PF) 100 MCG/2ML IJ SOLN
25.0000 ug | INTRAMUSCULAR | Status: DC | PRN
  Administered 2023-08-19: 50 ug via INTRAVENOUS
  Administered 2023-08-19: 25 ug via INTRAVENOUS
  Administered 2023-08-19: 50 ug via INTRAVENOUS

## 2023-08-19 MED ORDER — ACETAMINOPHEN 10 MG/ML IV SOLN: 1000.0000 mg | Freq: Once | INTRAVENOUS | Status: DC | PRN

## 2023-08-19 MED ORDER — FENTANYL CITRATE (PF) 250 MCG/5ML IJ SOLN
INTRAMUSCULAR | Status: AC
  Filled 2023-08-19: qty 5

## 2023-08-19 SURGICAL SUPPLY — 41 items
BAG COUNTER SPONGE SURGICOUNT (BAG) ×1 IMPLANT
BLADE CLIPPER SURG (BLADE) IMPLANT
CANISTER SUCT 3000ML PPV (MISCELLANEOUS) ×1 IMPLANT
CHLORAPREP W/TINT 26 (MISCELLANEOUS) ×1 IMPLANT
CLIP LIGATING HEMO O LOK GREEN (MISCELLANEOUS) ×1 IMPLANT
CNTNR URN SCR LID CUP LEK RST (MISCELLANEOUS) IMPLANT
COVER SURGICAL LIGHT HANDLE (MISCELLANEOUS) ×1 IMPLANT
DERMABOND ADVANCED .7 DNX12 (GAUZE/BANDAGES/DRESSINGS) ×1 IMPLANT
ELECT REM PT RETURN 9FT ADLT (ELECTROSURGICAL) ×1
ELECTRODE REM PT RTRN 9FT ADLT (ELECTROSURGICAL) ×1 IMPLANT
GLOVE BIOGEL PI MICRO STRL 6 (GLOVE) ×1 IMPLANT
GLOVE INDICATOR 6.5 STRL GRN (GLOVE) ×1 IMPLANT
GOWN STRL REUS W/ TWL LRG LVL3 (GOWN DISPOSABLE) ×3 IMPLANT
GRASPER SUT TROCAR 14GX15 (MISCELLANEOUS) ×1 IMPLANT
IRRIG SUCT STRYKERFLOW 2 WTIP (MISCELLANEOUS) ×1
IRRIGATION SUCT STRKRFLW 2 WTP (MISCELLANEOUS) ×1 IMPLANT
KIT BASIN OR (CUSTOM PROCEDURE TRAY) ×1 IMPLANT
KIT IMAGING PINPOINTPAQ (MISCELLANEOUS) IMPLANT
KIT TURNOVER KIT B (KITS) ×1 IMPLANT
L-HOOK LAP DISP 36CM (ELECTROSURGICAL) ×1
LHOOK LAP DISP 36CM (ELECTROSURGICAL) ×1 IMPLANT
NDL INSUFFLATION 14GA 120MM (NEEDLE) ×1 IMPLANT
NEEDLE INSUFFLATION 14GA 120MM (NEEDLE) ×1
NS IRRIG 1000ML POUR BTL (IV SOLUTION) ×1 IMPLANT
PAD ARMBOARD 7.5X6 YLW CONV (MISCELLANEOUS) ×1 IMPLANT
PENCIL BUTTON HOLSTER BLD 10FT (ELECTRODE) ×1 IMPLANT
SCISSORS LAP 5X35 DISP (ENDOMECHANICALS) ×1 IMPLANT
SET TUBE SMOKE EVAC HIGH FLOW (TUBING) ×1 IMPLANT
SLEEVE Z-THREAD 5X100MM (TROCAR) ×2 IMPLANT
SPECIMEN JAR SMALL (MISCELLANEOUS) ×1 IMPLANT
SUT MNCRL AB 4-0 PS2 18 (SUTURE) ×1 IMPLANT
SYS BAG RETRIEVAL 10MM (BASKET) ×1
SYSTEM BAG RETRIEVAL 10MM (BASKET) ×1 IMPLANT
TOWEL GREEN STERILE (TOWEL DISPOSABLE) IMPLANT
TOWEL GREEN STERILE FF (TOWEL DISPOSABLE) ×1 IMPLANT
TRAY LAPAROSCOPIC MC (CUSTOM PROCEDURE TRAY) ×1 IMPLANT
TROCAR 11X100 Z THREAD (TROCAR) IMPLANT
TROCAR Z THREAD OPTICAL 12X100 (TROCAR) ×1 IMPLANT
TROCAR Z-THREAD OPTICAL 5X100M (TROCAR) ×1 IMPLANT
WARMER LAPAROSCOPE (MISCELLANEOUS) ×1 IMPLANT
WATER STERILE IRR 1000ML POUR (IV SOLUTION) ×1 IMPLANT

## 2023-08-19 NOTE — Anesthesia Preprocedure Evaluation (Signed)
Anesthesia Evaluation  Patient identified by MRN, date of birth, ID band Patient awake    Reviewed: Allergy & Precautions, NPO status , Patient's Chart, lab work & pertinent test results, reviewed documented beta blocker date and time   History of Anesthesia Complications Negative for: history of anesthetic complications  Airway Mallampati: II  TM Distance: >3 FB     Dental no notable dental hx.    Pulmonary neg COPD, neg PE   breath sounds clear to auscultation       Cardiovascular (-) angina (-) CAD, (-) Past MI, (-) Cardiac Stents and (-) CABG Normal cardiovascular exam Rhythm:Regular Rate:Normal     Neuro/Psych neg Seizures    GI/Hepatic ,GERD  ,,(+) neg Cirrhosis      cholecystitis   Endo/Other    Renal/GU Renal disease     Musculoskeletal  (+) Arthritis ,    Abdominal   Peds  Hematology   Anesthesia Other Findings   Reproductive/Obstetrics                             Anesthesia Physical Anesthesia Plan  ASA: 2  Anesthesia Plan: General   Post-op Pain Management:    Induction: Intravenous  PONV Risk Score and Plan: 2 and Ondansetron and Dexamethasone  Airway Management Planned: Oral ETT  Additional Equipment:   Intra-op Plan:   Post-operative Plan: Extubation in OR  Informed Consent: I have reviewed the patients History and Physical, chart, labs and discussed the procedure including the risks, benefits and alternatives for the proposed anesthesia with the patient or authorized representative who has indicated his/her understanding and acceptance.     Dental advisory given  Plan Discussed with: CRNA  Anesthesia Plan Comments:        Anesthesia Quick Evaluation

## 2023-08-19 NOTE — Transfer of Care (Signed)
Immediate Anesthesia Transfer of Care Note  Patient: Debbie Zimmerman  Procedure(s) Performed: LAPAROSCOPIC CHOLECYSTECTOMY (Abdomen)  Patient Location: PACU  Anesthesia Type:General  Level of Consciousness: awake, alert , and oriented  Airway & Oxygen Therapy: Patient Spontanous Breathing and Patient connected to nasal cannula oxygen  Post-op Assessment: Report given to RN and Post -op Vital signs reviewed and stable  Post vital signs: Reviewed and stable  Last Vitals:  Vitals Value Taken Time  BP 133/120 08/19/23 0837  Temp    Pulse 85 08/19/23 0839  Resp 15 08/19/23 0839  SpO2 100 % 08/19/23 0839  Vitals shown include unfiled device data.  Last Pain:  Vitals:   08/19/23 0615  PainSc: 0-No pain         Complications: No notable events documented.

## 2023-08-19 NOTE — Anesthesia Postprocedure Evaluation (Signed)
Anesthesia Post Note  Patient: Rowena Friends  Procedure(s) Performed: LAPAROSCOPIC CHOLECYSTECTOMY (Abdomen)     Patient location during evaluation: PACU Anesthesia Type: General Level of consciousness: awake and alert Pain management: pain level controlled Vital Signs Assessment: post-procedure vital signs reviewed and stable Respiratory status: spontaneous breathing, nonlabored ventilation, respiratory function stable and patient connected to nasal cannula oxygen Cardiovascular status: blood pressure returned to baseline and stable Postop Assessment: no apparent nausea or vomiting Anesthetic complications: no   There were no known notable events for this encounter.  Last Vitals:  Vitals:   08/19/23 0930 08/19/23 0945  BP: 106/64 115/66  Pulse: 82 78  Resp: 15 10  Temp: 37 C   SpO2: 95% 92%    Last Pain:  Vitals:   08/19/23 0930  PainSc: 4                  Mariann Barter

## 2023-08-19 NOTE — Op Note (Signed)
08/19/2023 7:26 AM  PATIENT: Debbie Zimmerman  57 y.o. female  Patient Care Team: Jarrett Soho, PA-C as PCP - General (Family Medicine)  PRE-OPERATIVE DIAGNOSIS: Symptomatic Cholelithiasis  POST-OPERATIVE DIAGNOSIS: Cholelithiasis with chronic cholecystitis.  PROCEDURE: Laparoscopic cholecystectomy  SURGEON: Donata Duff, MD  ASSISTANT: Violeta Gelinas, MD  ANESTHESIA: General endotracheal  EBL: 5cc  DRAINS: None  SPECIMEN: Gallbladder  COUNTS: Sponge, needle and instrument counts were reported correct x2 at the conclusion of the operation  DISPOSITION: PACU in satisfactory condition  COMPLICATIONS: None  FINDINGS: Chronic cholecystitis with cholelithiasis--large stone in neck of gallbladder.  DESCRIPTION:  The patient was identified & brought into the operating room. Patient was then positioned supine on the OR table. SCDs were in place and active during the entire case. She then underwent general endotracheal anesthesia. Pressure points were padded. Hair on the abdomen was clipped by the OR team. The abdomen was prepped and draped in the standard sterile fashion. Antibiotics were administered. A surgical timeout was performed and confirmed our plan.   A periumbilical incision was made. The umbilical stalk was grasped and retracted outwardly. A veress needle was inserted and the abdomen was insufflated to . The veress was then removed and exchanged for a 5mm trocar optiview using a 30 degree scope and the abdomen was entered under direct visualization. Inspection confirmed no evidence of trocar site complications. The patient was then positioned in reverse Trendelenburg with slight left side down. A 12 mm supxiphoid trocar was placed under direct visualization and two additional 5mm trocars were placed along the right subcostal line - one 5mm port in mid subcostal region, another 5mm port in the right flank near the anterior axillary line.  The liver and  gallbladder were inspected. The gallbladder fundus was grasped and elevated cephalad. An additional grasper was then placed on the infundibulum of the gallbladder and the infundibulum was retracted laterally. Staying high on the gallbladder, the peritoneum on both sides of the gallbladder was opened with hook cautery. Gentle blunt dissection was then employed with a Art gallery manager working down into [employer]. The cystic duct was identified and carefully circumferentially dissected. The cystic artery was also identified and carefully circumferentially dissected. The space between the cystic artery and hepatocystic plate was developed such that a good view of the liver could be seen through a window medial to the cystic artery. The triangle of Calot had been cleared of all fibrofatty tissue. At this point, a critical view of safety was achieved and the only structures visualized was the skeletonized cystic duct laterally, the skeletonized cystic artery and the liver through the window medial to the artery. No posterior cystic artery was noted  A cholangiogram was not performed at this point as no stones noted in the cystic duct on palpation and obvious large stone in neck of gallbladder.  The cystic duct and artery were clipped with 2 hemolock clips on the patient side and 1 clip on the specimen side. The cystic duct and artery were then divided. The gallbladder was then freed from its remaining attachments to the liver using electrocautery and placed into an endocatch bag. The RUQ was gently irrigated with sterile saline. Hemostasis was then verified. The clips were in good position; the gallbladder fossa was dry. The rest of the abdomen was inspected no injury nor bleeding elsewhere was identified.  The endocatch bag containing the gallbladder was then removed from the subxiphoid port site and passed off as specimen. The subxiphoid port fascia was then  closed in a figured of eight fashion with 0  vicryl using a suture passer. The RUQ ports were removed under direct visualization and noted to be hemostatic.Marland Kitchen The fascia was palpated and noted to be completely closed. The abdomen was then desufflated and the periumbilical trocar removed. The skin of all incision sites was approximated with 4-0 monocryl subcuticular suture and dermabond applied. The was then awakened from anesthesia, extubated, and transferred to a stretcher for transport to PACU in satisfactory condition.  Instrument, sponge, and needle counts were correct at closure and at the conclusion of the case.   Donata Duff, MD [facility] Surgery

## 2023-08-19 NOTE — Anesthesia Procedure Notes (Signed)
Procedure Name: Intubation Date/Time: 08/19/2023 7:34 AM  Performed by: Allyn Kenner, CRNAPre-anesthesia Checklist: Patient identified, Emergency Drugs available, Suction available and Patient being monitored Patient Re-evaluated:Patient Re-evaluated prior to induction Oxygen Delivery Method: Circle System Utilized Preoxygenation: Pre-oxygenation with 100% oxygen Induction Type: IV induction Ventilation: Mask ventilation without difficulty Laryngoscope Size: Mac and 3 Grade View: Grade I Tube type: Oral Tube size: 7.0 mm Number of attempts: 1 Airway Equipment and Method: Stylet and Oral airway Placement Confirmation: ETT inserted through vocal cords under direct vision, positive ETCO2 and breath sounds checked- equal and bilateral Secured at: 21 cm Tube secured with: Tape Dental Injury: Teeth and Oropharynx as per pre-operative assessment

## 2023-08-19 NOTE — H&P (Signed)
Reason for Consult/Chief Complaint:  Symptomatic Cholelithiasis  HPI  Debbie Zimmerman is a 57 y.o. female who is seen today as an office consultation for evaluation of symptomatic cholelithiasis.   Patient states she has had a few years of episodes of RUQ and epigastric pain radiating to chest after eating, especially greasier foods. The pain will last for several hours before resolving. She has had GI workup including EGD which showed gastritis. She was on PPI but noted no improvement in symptoms. PPI recently stopped and no change in her symptoms. She has changed her diet to avoid heavier fatty foods and focus on low fat and has noted improvement in symptoms. However, she wants to be able to eat these foods in moderation and cannot tolerate them at all without nausea and emesis and long standing pain.  10 point review of systems is negative except as listed above in HPI.  Objective  Past Medical History: Past Medical History:  Diagnosis Date   Chest discomfort    not currently, only when gall bladder flare-ups occur   DDD (degenerative disc disease), lumbar 03/29/2019   Epigastric pain 03/29/2019   GERD (gastroesophageal reflux disease)    History of anemia 11/27/2016   Incontinence    oab- [facility]   Lightheaded    OAB (overactive bladder) 03/29/2019   Pain in lower back    Plantar fasciitis 03/29/2019   Slow transit constipation 03/29/2019    Past Surgical History: Past Surgical History:  Procedure Laterality Date   AUGMENTATION MAMMAPLASTY     CESAREAN SECTION      Family History:  Family History  Problem Relation Age of Onset   Diabetes Mother    CAD Father    Heart Problems Maternal Grandfather    Heart Problems Paternal Grandmother    Heart Problems Paternal Grandfather     Social History:  reports that she has never smoked. She has never used smokeless tobacco. She reports that she does not drink alcohol and does not use drugs.  Allergies:   Allergies  Allergen Reactions   Codeine Other (See Comments)    Other reaction(s): passes out PASSES OUT   Diclofenac     Pt stated, "My fingers felt tingly and I got chest pain"   Trimethoprim-Sulfamethoxazole [Sulfamethoxazole-Trimethoprim] Other (See Comments)    Headache    Medications: I have reviewed the patient's current medications.  Labs: I have personally reviewed all labs for the past 24h No results found for this or any previous visit (from the past 48 hours).  Imaging: I have personally reviewed and interpreted all imaging for the past 24h and agree with the radiologist's impression.  No results found.   Physical Exam Blood pressure 117/64, pulse 74, temperature 97.8 F (36.6 C), resp. rate 17, height 4\' 10"  (1.473 m), weight 62.6 kg, last menstrual period 10/02/2016, SpO2 100%. General: No acute distress, well appearing HEENT: PERRL, hearing grossly normal, mucous membranes moist CV: Regular rate and rhythm Pulm: Normal work of breathing on room air Abd: Soft, nontender, nondistended Extremities: Warm and well perfused Neuro: A&O x4, no focal neurologic deficits Psych: Appropriate mood and effect    Assessment: Debbie Zimmerman is an 57 y.o. female who presents for lap chole due to symptomatic cholelithiasis  Plan: - Proceed with laparoscopic cholecystectomy, possible intraoperative cholangiogram -We discussed the etiology of patient's pain, we discussed treatment options and recommended surgery. We discussed details of surgery including general anesthesia, laparoscopic approach, identification of cystic duct and common  bile duct. Ligation of cystic duct and cystic artery. Possible need for intraoperative cholangiogram, open procedure, and subtotal cholecystectomy. Possible risks of common bile duct injury, injury to surrounding structures, bile leak, bleeding, infection, diarrhea, retained stone and hernia. The patient showed good understanding and all  questions were answered   Debbie Duff, MD [facility] Surgery

## 2023-08-20 ENCOUNTER — Encounter (HOSPITAL_COMMUNITY): Payer: Self-pay | Admitting: General Surgery

## 2023-08-20 LAB — SURGICAL PATHOLOGY

## 2023-10-21 ENCOUNTER — Other Ambulatory Visit (HOSPITAL_COMMUNITY)
Admission: RE | Admit: 2023-10-21 | Discharge: 2023-10-21 | Disposition: A | Discharge status: Home / Self Care | Source: Ambulatory Visit | Attending: Nurse Practitioner | Admitting: Nurse Practitioner

## 2023-10-21 ENCOUNTER — Other Ambulatory Visit: Payer: Self-pay | Admitting: Nurse Practitioner

## 2023-10-21 DIAGNOSIS — Z124 Encounter for screening for malignant neoplasm of cervix: Secondary | ICD-10-CM | POA: Insufficient documentation

## 2023-10-27 LAB — CYTOLOGY - PAP
Comment: NEGATIVE
Diagnosis: NEGATIVE
Diagnosis: REACTIVE
High risk HPV: NEGATIVE

## 2024-02-25 ENCOUNTER — Encounter (HOSPITAL_BASED_OUTPATIENT_CLINIC_OR_DEPARTMENT_OTHER): Payer: Self-pay | Admitting: Emergency Medicine

## 2024-02-25 ENCOUNTER — Other Ambulatory Visit: Payer: Self-pay

## 2024-02-25 ENCOUNTER — Emergency Department (HOSPITAL_BASED_OUTPATIENT_CLINIC_OR_DEPARTMENT_OTHER)
Admission: EM | Admit: 2024-02-25 | Discharge: 2024-02-25 | Disposition: A | Discharge status: Home / Self Care | Attending: Emergency Medicine | Admitting: Emergency Medicine

## 2024-02-25 DIAGNOSIS — T63441A Toxic effect of venom of bees, accidental (unintentional), initial encounter: Secondary | ICD-10-CM | POA: Insufficient documentation

## 2024-02-25 DIAGNOSIS — T63481A Toxic effect of venom of other arthropod, accidental (unintentional), initial encounter: Secondary | ICD-10-CM

## 2024-02-25 DIAGNOSIS — L500 Allergic urticaria: Secondary | ICD-10-CM | POA: Diagnosis not present

## 2024-02-25 DIAGNOSIS — R000 Tachycardia, unspecified: Secondary | ICD-10-CM | POA: Insufficient documentation

## 2024-02-25 LAB — CBC WITH DIFFERENTIAL/PLATELET
Abs Immature Granulocytes: 0.01 K/uL (ref 0.00–0.07)
Basophils Absolute: 0 K/uL (ref 0.0–0.1)
Basophils Relative: 0 %
Eosinophils Absolute: 0.1 K/uL (ref 0.0–0.5)
Eosinophils Relative: 2 %
HCT: 35.8 % — ABNORMAL LOW (ref 36.0–46.0)
Hemoglobin: 12.2 g/dL (ref 12.0–15.0)
Immature Granulocytes: 0 %
Lymphocytes Relative: 30 %
Lymphs Abs: 1.8 K/uL (ref 0.7–4.0)
MCH: 30.3 pg (ref 26.0–34.0)
MCHC: 34.1 g/dL (ref 30.0–36.0)
MCV: 88.8 fL (ref 80.0–100.0)
Monocytes Absolute: 0.4 K/uL (ref 0.1–1.0)
Monocytes Relative: 6 %
Neutro Abs: 3.6 K/uL (ref 1.7–7.7)
Neutrophils Relative %: 62 %
Platelets: 238 K/uL (ref 150–400)
RBC: 4.03 MIL/uL (ref 3.87–5.11)
RDW: 12.6 % (ref 11.5–15.5)
WBC: 5.9 K/uL (ref 4.0–10.5)
nRBC: 0 % (ref 0.0–0.2)

## 2024-02-25 LAB — BASIC METABOLIC PANEL WITH GFR
Anion gap: 11 (ref 5–15)
BUN: 14 mg/dL (ref 6–20)
CO2: 25 mmol/L (ref 22–32)
Calcium: 9 mg/dL (ref 8.9–10.3)
Chloride: 102 mmol/L (ref 98–111)
Creatinine, Ser: 0.82 mg/dL (ref 0.44–1.00)
GFR, Estimated: >60 mL/min (ref >60)
Glucose, Bld: 130 mg/dL — ABNORMAL HIGH (ref 70–99)
Potassium: 3.7 mmol/L (ref 3.5–5.1)
Sodium: 139 mmol/L (ref 135–145)

## 2024-02-25 MED ORDER — CETIRIZINE HCL 10 MG PO TABS: 10.0000 mg | ORAL_TABLET | Freq: Every day | ORAL | 0 refills | Status: AC

## 2024-02-25 MED ORDER — FAMOTIDINE IN NACL 20-0.9 MG/50ML-% IV SOLN
20.0000 mg | Freq: Once | INTRAVENOUS | Status: AC
  Administered 2024-02-25: 20 mg via INTRAVENOUS

## 2024-02-25 MED ORDER — PREDNISONE 10 MG (21) PO TBPK: ORAL_TABLET | Freq: Every day | ORAL | 0 refills | Status: AC

## 2024-02-25 MED ORDER — EPINEPHRINE 0.3 MG/0.3ML IJ SOAJ: 0.3000 mg | INTRAMUSCULAR | 0 refills | Status: AC | PRN

## 2024-02-25 MED ORDER — HYDROCODONE-ACETAMINOPHEN 5-325 MG PO TABS
1.0000 | ORAL_TABLET | Freq: Once | ORAL | Status: AC
  Administered 2024-02-25: 1 via ORAL
  Filled 2024-02-25: qty 1

## 2024-02-25 MED ORDER — DIPHENHYDRAMINE HCL 50 MG/ML IJ SOLN
50.0000 mg | Freq: Once | INTRAMUSCULAR | Status: AC
  Administered 2024-02-25: 50 mg via INTRAVENOUS
  Filled 2024-02-25: qty 1

## 2024-02-25 MED ORDER — METHYLPREDNISOLONE SODIUM SUCC 125 MG IJ SOLR
125.0000 mg | Freq: Once | INTRAMUSCULAR | Status: AC
  Administered 2024-02-25: 125 mg via INTRAVENOUS
  Filled 2024-02-25: qty 2

## 2024-02-25 MED ORDER — KETOROLAC TROMETHAMINE 15 MG/ML IJ SOLN
15.0000 mg | Freq: Once | INTRAMUSCULAR | Status: AC
  Administered 2024-02-25: 15 mg via INTRAVENOUS
  Filled 2024-02-25: qty 1

## 2024-02-25 MED ORDER — HYDROCODONE-ACETAMINOPHEN 5-325 MG PO TABS: 1.0000 | ORAL_TABLET | ORAL | 0 refills | Status: AC | PRN

## 2024-02-25 MED ORDER — DIPHENHYDRAMINE HCL 25 MG PO TABS: 25.0000 mg | ORAL_TABLET | Freq: Four times a day (QID) | ORAL | 0 refills | Status: AC | PRN

## 2024-02-25 MED ORDER — SODIUM CHLORIDE 0.9 % IV BOLUS
1000.0000 mL | Freq: Once | INTRAVENOUS | Status: AC
Start: 2024-02-25 — End: 2024-02-25
  Administered 2024-02-25: 1000 mL via INTRAVENOUS

## 2024-02-25 NOTE — Discharge Instructions (Addendum)
 It was a pleasure caring for you today in the emergency department.  Recommend avoid exposure in the future.  Be sure to get plenty of rest.  Drink plenty of fluids, follow-up with your PCP.  You have also been prescribed an epi pen, if you develop a severe allergic reaction following a sting in the future which includes diffuse rash, trouble breathing/swallowing, vomiting; please use epi pen and call [phone]  Please return to the emergency department for any worsening or worrisome symptoms.

## 2024-02-25 NOTE — ED Triage Notes (Signed)
 Pt stung by multiple yellow jackets to arms and legs, possibly on the face; c/o burning and pain

## 2024-02-25 NOTE — ED Provider Notes (Signed)
 [place] [facility] AT [facility] Provider Note  CSN: [id number] Arrival date & time: 02/25/24 1531  Chief Complaint(s) Insect Bite (Bee Stings)  HPI Debbie Zimmerman is a 58 y.o. female with past medical history as below, significant for GERD, overactive bladder, DDD, plantar fasciitis who presents to the ED with complaint of bee sting  Patient reports just prior to arrival she was stung by multiple flying insects present to be yellow jackets.  She was stung to her extremities and her torso.  No stings to her mucous membranes, mouth.  She denies any difficulty breathing, no nausea or vomiting, no dysphagia or dysphonia.  No medication prior to arrival.  Denies similar reaction in the past from bee stings.  Normocephalic prior to symptom onset  Past Medical History Past Medical History:  Diagnosis Date   Chest discomfort    not currently, only when gall bladder flare-ups occur   DDD (degenerative disc disease), lumbar 03/29/2019   Epigastric pain 03/29/2019   GERD (gastroesophageal reflux disease)    History of anemia 11/27/2016   Incontinence    oab- [facility]   Lightheaded    OAB (overactive bladder) 03/29/2019   Pain in lower back    Plantar fasciitis 03/29/2019   Slow transit constipation 03/29/2019   Patient Active Problem List   Diagnosis Date Noted   Slow transit constipation 03/29/2019   OAB (overactive bladder) 03/29/2019   Epigastric pain 03/29/2019   DDD (degenerative disc disease), lumbar 03/29/2019   Plantar fasciitis 03/29/2019   Pain in lower back    Lightheaded    Incontinence    Chest discomfort    History of anemia 11/27/2016   Urinary urgency 11/27/2016   Increased frequency of urination 11/27/2016   Home Medication(s) Prior to Admission medications   Medication Sig Start Date End Date Taking? Authorizing Provider  conjugated estrogens (PREMARIN) vaginal cream Place 1 applicator vaginally once a week. 10/19/22    [provider]  GEMTESA 75 MG TABS Take 1 tablet by mouth daily. 07/02/23   [provider]  NONFORMULARY OR COMPOUNDED ITEM [facility]: Achilles Tendonitis - Baclofen 2%, Bupivacaine  1%, Gabapentin 6%, Ibuprofen 3%, Pentoxifylline 3%, apply 1-2 grams to affected area 3-4 times a day. (omit Diclofenac ) Patient not taking: Reported on 08/11/2023 03/30/19   Magdalen Pasco RAMAN, DPM  omeprazole (PRILOSEC) 40 MG capsule Take 40 mg by mouth every morning. 08/02/23   [provider]                                                                                                                                    Past Surgical History Past Surgical History:  Procedure Laterality Date   AUGMENTATION MAMMAPLASTY     CESAREAN SECTION     CHOLECYSTECTOMY N/A 08/19/2023   Procedure: LAPAROSCOPIC CHOLECYSTECTOMY;  Surgeon: Ann Fine, MD;  Location: [facility] [place] [facility];  Service: General;  Laterality: N/A;  Family History Family History  Problem Relation Age of Onset   Diabetes Mother    CAD Father    Heart Problems Maternal Grandfather    Heart Problems Paternal Grandmother    Heart Problems Paternal Grandfather     Social History Social History   Tobacco Use   Smoking status: Never   Smokeless tobacco: Never  Vaping Use   Vaping status: Never Used  Substance Use Topics   Alcohol use: No   Drug use: No   Allergies Codeine, Diclofenac , and Trimethoprim-sulfamethoxazole [sulfamethoxazole-trimethoprim]  Review of Systems A thorough review of systems was obtained and all systems are negative except as noted in the HPI and PMH.   Physical Exam Vital Signs  I have reviewed the triage vital signs BP 126/87   Pulse 74   Temp (!) 97.1 F (36.2 C)   Resp 18   Ht 4' 10 (1.473 m)   Wt 67.1 kg   LMP 10/02/2016   SpO2 98%   BMI 30.93 kg/m  Physical Exam Vitals and nursing note reviewed.  Constitutional:      General: She is not in acute distress.    Appearance:  Normal appearance. She is well-developed. She is not ill-appearing.  HENT:     Head: Normocephalic and atraumatic.     Jaw: There is normal jaw occlusion.     Right Ear: External ear normal.     Left Ear: External ear normal.     Nose: Nose normal.     Mouth/Throat:     Mouth: Mucous membranes are moist.     Pharynx: Oropharynx is clear. Uvula midline.     Comments: No drooling trismus or stridor Eyes:     General: No scleral icterus.       Right eye: No discharge.        Left eye: No discharge.  Cardiovascular:     Rate and Rhythm: Tachycardia present.  Pulmonary:     Effort: Pulmonary effort is normal. No respiratory distress.     Breath sounds: No stridor.  Abdominal:     General: Abdomen is flat. There is no distension.     Tenderness: There is no guarding.  Musculoskeletal:        General: No deformity.     Cervical back: No rigidity.  Skin:    General: Skin is warm and dry.     Coloration: Skin is not cyanotic, jaundiced or pale.     Findings: Rash present. Rash is urticarial.  Neurological:     Mental Status: She is alert and oriented to person, place, and time.     GCS: GCS eye subscore is 4. GCS verbal subscore is 5. GCS motor subscore is 6.  Psychiatric:        Speech: Speech normal.        Behavior: Behavior normal. Behavior is cooperative.     ED Results and Treatments Labs (all labs ordered are listed, but only abnormal results are displayed) Labs Reviewed  CBC WITH DIFFERENTIAL/PLATELET - Abnormal; Notable for the following components:      Result Value   HCT 35.8 (*)    All other components within normal limits  BASIC METABOLIC PANEL WITH GFR - Abnormal; Notable for the following components:   Glucose, Bld 130 (*)    All other components within normal limits  Radiology No results found.  Pertinent labs & imaging results that were  available during my care of the patient were reviewed by me and considered in my medical decision making (see MDM for details).  Medications Ordered in ED Medications  ketorolac  (TORADOL ) 15 MG/ML injection 15 mg (15 mg Intravenous Given 02/25/24 1608)  diphenhydrAMINE  (BENADRYL ) injection 50 mg (50 mg Intravenous Given 02/25/24 1609)  sodium chloride  0.9 % bolus 1,000 mL (0 mLs Intravenous Stopped 02/25/24 1757)  methylPREDNISolone  sodium succinate (SOLU-MEDROL ) 125 mg/2 mL injection 125 mg (125 mg Intravenous Given 02/25/24 1608)  famotidine  (PEPCID ) IVPB 20 mg premix (0 mg Intravenous Stopped 02/25/24 1757)  HYDROcodone -acetaminophen  (NORCO/VICODIN) 5-325 MG per tablet 1 tablet (1 tablet Oral Given 02/25/24 1724)                                                                                                                                     Procedures Procedures  (including critical care time)  Medical Decision Making / ED Course    Medical Decision Making:    Tanayah Squitieri is a 58 y.o. female with past medical history as below, significant for GERD, overactive bladder, DDD, plantar fasciitis who presents to the ED with complaint of bee sting. The complaint involves an extensive differential diagnosis and also carries with it a high risk of complications and morbidity.  Serious etiology was considered. Ddx includes but is not limited to: Anaphylaxis, allergic reaction, contact dermatitis, irritant dermatitis, cellulitis, etc.  Complete initial physical exam performed, notably the patient was in no acute distress, airway intact.    Reviewed and confirmed nursing documentation for past medical history, family history, social history.  Vital signs reviewed.     Brief summary:  58 year old female with history above here with complaint of insect sting She has multiple insect bites noted to extremities.  No bites to mucous membranes.  No oropharyngeal swelling.  No drooling stridor or  trismus.  No hypoxia.  She is tachycardic. Give allergy cocktail, give fluids, Toradol , check labs Clinical Course as of 02/25/24 1921  Fri Feb 25, 2024  1843 Symptoms greatly improved  [SG]    Clinical Course User Index [SG] Elnor Jayson LABOR, DO     Insect bite/ sting mx> -multiple sites noted to torso and extremities -does not appear c/w anaphylaxis -greatly improved, airway remains intact, tolerating PO -dc w/ antihistamine, steroids, analgesia, advised f/u with pcp   Patient in no distress and overall condition is stable. Detailed discussions were had with the patient/guardian regarding current findings, and need for close f/u with PCP or on call doctor. The patient/guardian has been instructed to return immediately if the symptoms worsen in any way for re-evaluation. Patient/guardian verbalized understanding and is in agreement with current care plan. All questions answered prior to discharge.            Additional history obtained: -Additional history obtained from spouse -External records  from outside source obtained and reviewed including: Chart review including previous notes, labs, imaging, consultation notes including  Allergies Prior surgical note   Lab Tests: -I ordered, reviewed, and interpreted labs.   The pertinent results include:   Labs Reviewed  CBC WITH DIFFERENTIAL/PLATELET - Abnormal; Notable for the following components:      Result Value   HCT 35.8 (*)    All other components within normal limits  BASIC METABOLIC PANEL WITH GFR - Abnormal; Notable for the following components:   Glucose, Bld 130 (*)    All other components within normal limits    Notable for labs stable  EKG   EKG Interpretation Date/Time:    Ventricular Rate:    PR Interval:    QRS Duration:    QT Interval:    QTC Calculation:   R Axis:      Text Interpretation:           Imaging Studies ordered: na   Medicines ordered and prescription drug  management: Meds ordered this encounter  Medications   ketorolac  (TORADOL ) 15 MG/ML injection 15 mg   diphenhydrAMINE  (BENADRYL ) injection 50 mg   sodium chloride  0.9 % bolus 1,000 mL   methylPREDNISolone  sodium succinate (SOLU-MEDROL ) 125 mg/2 mL injection 125 mg   famotidine  (PEPCID ) IVPB 20 mg premix   HYDROcodone -acetaminophen  (NORCO/VICODIN) 5-325 MG per tablet 1 tablet    Refill:  0    -I have reviewed the patients home medicines and have made adjustments as needed   Consultations Obtained: na   Cardiac Monitoring: The patient was maintained on a cardiac monitor.  I personally viewed and interpreted the cardiac monitored which showed an underlying rhythm of: sinus tachy > nsr Continuous pulse oximetry interpreted by myself, 100% on ra.    Social Determinants of Health:  Diagnosis or treatment significantly limited by social determinants of health: obesity   Reevaluation: After the interventions noted above, I reevaluated the patient and found that they have improved  Co morbidities that complicate the patient evaluation  Past Medical History:  Diagnosis Date   Chest discomfort    not currently, only when gall bladder flare-ups occur   DDD (degenerative disc disease), lumbar 03/29/2019   Epigastric pain 03/29/2019   GERD (gastroesophageal reflux disease)    History of anemia 11/27/2016   Incontinence    oab- [facility]   Lightheaded    OAB (overactive bladder) 03/29/2019   Pain in lower back    Plantar fasciitis 03/29/2019   Slow transit constipation 03/29/2019      Dispostion: Disposition decision including need for hospitalization was considered, and patient discharged from emergency department.    Final Clinical Impression(s) / ED Diagnoses Final diagnoses:  None        Elnor Jayson LABOR, DO 02/25/24 1921

## 2024-05-04 ENCOUNTER — Other Ambulatory Visit (HOSPITAL_BASED_OUTPATIENT_CLINIC_OR_DEPARTMENT_OTHER): Payer: Self-pay | Admitting: Family Medicine

## 2024-05-04 DIAGNOSIS — M8580 Other specified disorders of bone density and structure, unspecified site: Secondary | ICD-10-CM
